# Patient Record
Sex: Male | Born: 1980 | Race: White | Hispanic: No | Marital: Married | Smoking: Never smoker
Health system: Southern US, Community
[De-identification: ages and names within clinical notes are randomized; demographics above are authoritative.]

## PROBLEM LIST (undated history)

## (undated) DIAGNOSIS — K5792 Diverticulitis of intestine, part unspecified, without perforation or abscess without bleeding: Secondary | ICD-10-CM

## (undated) DIAGNOSIS — F32A Depression, unspecified: Secondary | ICD-10-CM

## (undated) DIAGNOSIS — R011 Cardiac murmur, unspecified: Secondary | ICD-10-CM

## (undated) HISTORY — PX: SHOULDER ARTHROSCOPY: SHX128

## (undated) HISTORY — DX: Diverticulitis of intestine, part unspecified, without perforation or abscess without bleeding: K57.92

## (undated) HISTORY — PX: COLONOSCOPY: SHX174

---

## 2012-04-16 ENCOUNTER — Emergency Department: Admit: 2012-04-16 | Discharge: 2012-04-16 | Disposition: A | Discharge status: Home / Self Care | Payer: 59

## 2012-04-16 ENCOUNTER — Emergency Department
Admission: EM | Admit: 2012-04-16 | Discharge: 2012-04-16 | Disposition: A | Discharge status: Home / Self Care | Payer: 59 | Source: Home / Self Care | Attending: Emergency Medicine | Admitting: Emergency Medicine

## 2012-04-16 ENCOUNTER — Emergency Department: Admit: 2012-04-16 | Payer: 59

## 2012-04-16 ENCOUNTER — Encounter: Payer: Self-pay | Admitting: *Deleted

## 2012-04-16 DIAGNOSIS — M79609 Pain in unspecified limb: Secondary | ICD-10-CM

## 2012-04-16 DIAGNOSIS — M79641 Pain in right hand: Secondary | ICD-10-CM
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
## 2012-04-17 ENCOUNTER — Encounter: Payer: Self-pay | Admitting: Family Medicine

## 2012-04-17 ENCOUNTER — Telehealth: Payer: Self-pay | Admitting: *Deleted

## 2012-04-17 ENCOUNTER — Ambulatory Visit (INDEPENDENT_AMBULATORY_CARE_PROVIDER_SITE_OTHER): Payer: 59 | Admitting: Family Medicine

## 2012-04-17 VITALS — BP 116/81 | HR 74 | Temp 97.5°F | Ht 71.0 in | Wt 174.0 lb

## 2012-04-17 DIAGNOSIS — S6990XA Unspecified injury of unspecified wrist, hand and finger(s), initial encounter: Secondary | ICD-10-CM

## 2012-04-17 DIAGNOSIS — S6991XA Unspecified injury of right wrist, hand and finger(s), initial encounter: Secondary | ICD-10-CM

## 2012-04-17 NOTE — ED Notes (Signed)
Patient is inquiring about the result of his x-ray. He was under the impression the radiologist read it  as fractured, as did Dr. Orson Aloe but he was seen by Dr. Pearletha Forge today who di dnot see the fracture. I read the x-ray and verified it was read as "no acute abnormality". I offered a copy of the report to the patient and he declined. He is schedule to have repeat x-ray done with Dr. Pearletha Forge in 10 days. Brian [name] had no further questions.

## 2012-04-17 NOTE — Patient Instructions (Signed)
I do not see an obvious fracture on your x-rays though they can be falsely normal with a hairline fracture. Wear the splint as directed - only take off to bathe and ice the area. Vicodin as needed for severe pain - no driving on this medication. Ice 15 minutes at a time 3-4 times a day. Elevate above the level of your heart when possible. Can take aleve 1-2 tabs twice a day with food if needed in addition to vicodin. Follow up with me in 10 days. Light duty - no use of right hand until further notice.

## 2012-04-18 ENCOUNTER — Encounter: Payer: Self-pay | Admitting: Family Medicine
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
## 2012-04-19 ENCOUNTER — Encounter: Payer: Self-pay | Admitting: Family Medicine

## 2012-04-19 DIAGNOSIS — S6991XA Unspecified injury of right wrist, hand and finger(s), initial encounter: Secondary | ICD-10-CM | POA: Insufficient documentation

## 2012-04-19 NOTE — Assessment & Plan Note (Signed)
reviewed radiographs and I do not appreciate a hairline fracture or other bony abnormalities though his clinical exam would fit with this possibility.  I advised patient we move forward with ulnar gutter splint for immobilization and protection with f/u in 1-2 weeks to remove this, repeat radiographs and exam.  Icing, vicodin q6h prn (#60 written), ibuprofen, elevation in meantime.

## 2012-05-01 ENCOUNTER — Encounter: Payer: Self-pay | Admitting: Family Medicine

## 2012-05-01 ENCOUNTER — Ambulatory Visit (INDEPENDENT_AMBULATORY_CARE_PROVIDER_SITE_OTHER): Payer: 59 | Admitting: Family Medicine

## 2012-05-01 ENCOUNTER — Ambulatory Visit (HOSPITAL_BASED_OUTPATIENT_CLINIC_OR_DEPARTMENT_OTHER)
Admission: RE | Admit: 2012-05-01 | Discharge: 2012-05-01 | Disposition: A | Discharge status: Home / Self Care | Payer: 59 | Source: Ambulatory Visit | Attending: Family Medicine | Admitting: Family Medicine

## 2012-05-01 VITALS — BP 121/78 | HR 73 | Temp 97.8°F | Ht 71.0 in | Wt 173.0 lb

## 2012-05-01 DIAGNOSIS — S6991XA Unspecified injury of right wrist, hand and finger(s), initial encounter: Secondary | ICD-10-CM

## 2012-05-01 DIAGNOSIS — S6991XD Unspecified injury of right wrist, hand and finger(s), subsequent encounter: Secondary | ICD-10-CM

## 2012-05-01 DIAGNOSIS — IMO0002 Reserved for concepts with insufficient information to code with codable children: Secondary | ICD-10-CM

## 2012-05-01 DIAGNOSIS — S6990XA Unspecified injury of unspecified wrist, hand and finger(s), initial encounter: Secondary | ICD-10-CM

## 2012-05-01 DIAGNOSIS — M79609 Pain in unspecified limb: Secondary | ICD-10-CM | POA: Insufficient documentation

## 2012-05-01 NOTE — Assessment & Plan Note (Signed)
radiographs repeated today do not show evidence of a fracture to 4th or 5th MC where he is tender.  Believe this is due to severe contusion.  Discontinue ulnar splinting.  OTC meds as needed (has vicodin also but not using), icing as needed, work on motion.  Believe it will still be another 2 weeks before he has full functionality back of his right hand (for gripping, lifting, carrying, picking things up).  Will keep him on light duty without use of right hand for 2 weeks then return him to full duty.  He's made good progress to this point and feel he will likely be near 100% 2 weeks from now.  F/u prn.
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
## 2012-05-02 ENCOUNTER — Encounter: Payer: Self-pay | Admitting: Family Medicine

## 2012-05-14 ENCOUNTER — Emergency Department (HOSPITAL_BASED_OUTPATIENT_CLINIC_OR_DEPARTMENT_OTHER): Payer: 59

## 2012-05-14 ENCOUNTER — Emergency Department (HOSPITAL_BASED_OUTPATIENT_CLINIC_OR_DEPARTMENT_OTHER)
Admission: EM | Admit: 2012-05-14 | Discharge: 2012-05-15 | Disposition: A | Discharge status: Home / Self Care | Payer: 59 | Attending: Emergency Medicine | Admitting: Emergency Medicine

## 2012-05-14 ENCOUNTER — Encounter (HOSPITAL_BASED_OUTPATIENT_CLINIC_OR_DEPARTMENT_OTHER): Payer: Self-pay | Admitting: *Deleted

## 2012-05-14 DIAGNOSIS — T63461A Toxic effect of venom of wasps, accidental (unintentional), initial encounter: Secondary | ICD-10-CM | POA: Insufficient documentation

## 2012-05-14 DIAGNOSIS — R51 Headache: Secondary | ICD-10-CM | POA: Insufficient documentation

## 2012-05-14 DIAGNOSIS — T6391XA Toxic effect of contact with unspecified venomous animal, accidental (unintentional), initial encounter: Secondary | ICD-10-CM | POA: Insufficient documentation

## 2012-05-14 DIAGNOSIS — T782XXA Anaphylactic shock, unspecified, initial encounter: Secondary | ICD-10-CM

## 2012-05-14 MED ORDER — EPINEPHRINE 0.3 MG/0.3ML IJ DEVI: 0.3000 mg | INTRAMUSCULAR | Status: DC | PRN

## 2012-05-14 MED ORDER — FAMOTIDINE IN NACL 20-0.9 MG/50ML-% IV SOLN
INTRAVENOUS | Status: AC
  Administered 2012-05-14: 20 mg
  Filled 2012-05-14: qty 50

## 2012-05-14 MED ORDER — SODIUM CHLORIDE 0.9 % IV BOLUS (SEPSIS)
1000.0000 mL | Freq: Once | INTRAVENOUS | Status: AC
  Administered 2012-05-14: 1000 mL via INTRAVENOUS

## 2012-05-14 MED ORDER — EPINEPHRINE 0.3 MG/0.3ML IJ DEVI
INTRAMUSCULAR | Status: AC
  Administered 2012-05-14: 0.3 mg
  Filled 2012-05-14: qty 0.3

## 2012-05-14 MED ORDER — METHYLPREDNISOLONE SODIUM SUCC 125 MG IJ SOLR
125.0000 mg | Freq: Once | INTRAMUSCULAR | Status: AC
  Administered 2012-05-14: 125 mg via INTRAVENOUS
  Filled 2012-05-14: qty 2

## 2012-05-14 MED ORDER — DIPHENHYDRAMINE HCL 25 MG PO TABS: 25.0000 mg | ORAL_TABLET | Freq: Four times a day (QID) | ORAL | Status: DC

## 2012-05-14 MED ORDER — FENTANYL CITRATE 0.05 MG/ML IJ SOLN
50.0000 ug | Freq: Once | INTRAMUSCULAR | Status: AC
  Administered 2012-05-14: 50 ug via INTRAVENOUS

## 2012-05-14 MED ORDER — PREDNISONE 10 MG PO TABS: 20.0000 mg | ORAL_TABLET | Freq: Two times a day (BID) | ORAL | Status: AC

## 2012-05-14 MED ORDER — ONDANSETRON HCL 4 MG/2ML IJ SOLN
INTRAMUSCULAR | Status: AC
  Administered 2012-05-14: 4 mg
  Filled 2012-05-14: qty 2

## 2012-05-14 MED ORDER — DIPHENHYDRAMINE HCL 50 MG/ML IJ SOLN
INTRAMUSCULAR | Status: AC
  Administered 2012-05-14: 25 mg
  Filled 2012-05-14: qty 1

## 2012-05-14 MED ORDER — DIPHENHYDRAMINE HCL 50 MG/ML IJ SOLN
12.5000 mg | Freq: Once | INTRAMUSCULAR | Status: AC
  Administered 2012-05-14: 12.5 mg via INTRAVENOUS
  Filled 2012-05-14: qty 1

## 2012-05-14 MED ORDER — FENTANYL CITRATE 0.05 MG/ML IJ SOLN
INTRAMUSCULAR | Status: AC
  Administered 2012-05-14: 50 ug via INTRAVENOUS
  Filled 2012-05-14: qty 2

## 2012-05-14 MED ORDER — METHYLPREDNISOLONE SODIUM SUCC 125 MG IJ SOLR
INTRAMUSCULAR | Status: AC
  Administered 2012-05-14: 125 mg
  Filled 2012-05-14: qty 2
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
## 2012-05-14 NOTE — Discharge Instructions (Signed)
Anaphylactic Reaction  An anaphylactic reaction is a severe allergic reaction. It may be caused by medicines, food, insect bites, or other common items. It cannot spread from one person to another (contagious). It can be life threatening and require hospitalization.   SYMPTOMS   Symptoms may include, but are not limited to, the following:   Skin rash or hives.   Itching.   Chest tightness.   Swelling (including the eyes, tongue, or lips).   Trouble breathing or swallowing.   Lightheadedness or fainting.   Stomach pains or vomiting.  Symptoms may gradually disappear when you are no longer around the substance that caused the problem. You may find that 2 or 3 days are needed for symptoms to go away completely.  HOME CARE INSTRUCTIONS    Carry a card or wear a bracelet that lists anything that has caused past anaphylaxis or a less severe allergic reaction.   If 1 or more medicines have caused past problems, you need to avoid the same or similar medicines in the future.   Talk with a medical caregiver before using any new prescription or over-the-counter medicines.   If you develop hives or a rash:   Apply cold compresses to the skin, or take a cool bath to help reduce itching.   Avoid hot baths or showers. This might make itching or the rash worse.   Wear loose-fitting clothes.   If you have had a severe allergic reaction in the past:   Carry an anaphylaxis kit with you at all times. Both you and family members should be shown how to give the medicines in the kit. This can be lifesaving if there is another severe reaction. If it needs to be used, and symptoms improve, it is still important for you to seek immediate medical care or call your local emergency services (911 in the [place] This is because severe symptoms can return when the medicine in the kit wears off.   If hospitalization is not required, you should have fast access to emergency services if the problem recurs. If a family member or friend  has been shown how to give the medicines in an anaphylaxis kit and can stay with you, he or she can also help give the medicines from the kit if problems return.   Make sure you renew your anaphylaxis kit when it gets close to being out of date.   You may return to your normal activities when the allergic symptoms are gone.  SEEK MEDICAL CARE IF:    You develop symptoms of an allergic reaction to a new substance. Symptoms may occur right away or minutes later.   Rash, hives, or itching occurs.   You have different symptoms than you have had previously.  SEEK IMMEDIATE MEDICAL CARE IF:    You have difficulty breathing, start to wheeze, or a tight feeling in the chest or throat develops.   You develop swelling of the mouth or tongue or swelling or itching over most of your body.   You develop severe stomach pains or repeated vomiting.   You feel very lightheaded or pass out.   Chest pain develops or there is a worsening of problems noted above. THIS IS AN EMERGENCY. Call your local emergency services (911 in the [place]  MAKE SURE YOU:    Understand these instructions.   Will watch your condition.   Will get help right away if you have recurrent problems or have problems that are getting worse.  Document Released:   Information 2012 [place], [place].Bee, Wasp, or Hornet Sting Your caregiver has diagnosed you as having an insect sting. An insect sting appears as a red lump in the skin that sometimes has a tiny hole in the center, or it may have a stinger in the center of the wound. The most common stings are from wasps, hornets and bees. Individuals have different reactions to insect stings.  A normal reaction may cause pain, swelling, and redness around the sting site.   A localized allergic reaction may cause swelling and redness that extends beyond the sting site.   A large local  reaction may continue to develop over the next 12 to 36 hours.   On occasion, the reactions can be severe (anaphylactic reaction). An anaphylactic reaction may cause wheezing; difficulty breathing; chest pain; fainting; raised, itchy, red patches on the skin; a sick feeling to your stomach (nausea); vomiting; cramping; or diarrhea. If you have had an anaphylactic reaction to an insect sting in the past, you are more likely to have one again.  HOME CARE INSTRUCTIONS   With bee stings, a small sac of poison is left in the wound. Brushing across this with something such as a credit card, or anything similar, will help remove this and decrease the amount of the reaction. This same procedure will not help a wasp sting as they do not leave behind a stinger and poison sac.   Apply a cold compress for 10 to 20 minutes every hour for 1 to 2 days, depending on severity, to reduce swelling and itching.   To lessen pain, a paste made of water and baking soda may be rubbed on the bite or sting and left on for 5 minutes.   To relieve itching and swelling, you may use take medication or apply medicated creams or lotions as directed.   Only take over-the-counter or prescription medicines for pain, discomfort, or fever as directed by your caregiver.   Wash the sting site daily with soap and water. Apply antibiotic ointment on the sting site as directed.   If you suffered a severe reaction:   If you did not require hospitalization, an adult will need to stay with you for 24 hours in case the symptoms return.   You may need to wear a medical bracelet or necklace stating the allergy.   You and your family need to learn when and how to use an anaphylaxis kit or epinephrine injection.   If you have had a severe reaction before, always carry your anaphylaxis kit with you.  SEEK MEDICAL CARE IF:   None of the above helps within 2 to 3 days.   The area becomes red, warm, tender, and swollen beyond the area of  the bite or sting.   You have an oral temperature above 102 F (38.9 C).  SEEK IMMEDIATE MEDICAL CARE IF:  You have symptoms of an allergic reaction which are:  Wheezing.   Difficulty breathing.   Chest pain.   Lightheadedness or fainting.   Itchy, raised, red patches on the skin.   Nausea, vomiting, cramping or diarrhea.  ANY OF THESE SYMPTOMS MAY REPRESENT A SERIOUS PROBLEM THAT IS AN EMERGENCY. Do not wait to see if the symptoms will go away. Get medical help right away. Call your local emergency services (911 in [place] DO NOT drive yourself to the hospital. MAKE SURE YOU:   Understand these instructions.   Will watch your condition.   Will get help right away if you are  not doing well or get worse.  Document Released: 11/15/2005 Document Revised: 11/04/2011 Document Reviewed: 05/02/2010 [facility] Patient Information 2012 [place], [place].

## 2012-05-14 NOTE — ED Notes (Addendum)
Presents with anaphylactic reaction to insect sting- took epi-pen and chewed 3 oral benadryl pta- EDP [facility] at bedside- meds given per his verbal order

## 2015-11-30 DIAGNOSIS — K5792 Diverticulitis of intestine, part unspecified, without perforation or abscess without bleeding: Secondary | ICD-10-CM

## 2015-11-30 HISTORY — DX: Diverticulitis of intestine, part unspecified, without perforation or abscess without bleeding: K57.92

## 2016-01-20 MED FILL — AMOXICILLIN 875 MG TABLET: 875 | 10 days supply | Qty: 20 | Fill #0

## 2016-04-09 LAB — HEPATIC FUNCTION PANEL
ALK PHOS: 90 U/L (ref 25–125)
ALT: 16 U/L (ref 10–40)
AST: 13 U/L — AB (ref 14–40)
BILIRUBIN, TOTAL: 1.3 mg/dL

## 2016-04-09 LAB — BASIC METABOLIC PANEL
BUN: 20 mg/dL (ref 4–21)
Creatinine: 1 mg/dL (ref 0.6–1.3)
Glucose: 79 mg/dL
POTASSIUM: 4.8 mmol/L (ref 3.4–5.3)
SODIUM: 142 mmol/L (ref 137–147)

## 2016-04-09 LAB — CBC AND DIFFERENTIAL
HEMATOCRIT: 46 % (ref 41–53)
Hemoglobin: 15.6 g/dL (ref 13.5–17.5)
Platelets: 242 10*3/uL (ref 150–399)
WBC: 10 10^3/mL

## 2016-04-09 LAB — TSH: TSH: 0.57 u[IU]/mL (ref 0.41–5.90)

## 2016-04-09 MED FILL — predniSONE 5 MG TABS: 5 | 6 days supply | Qty: 21 | Fill #0

## 2016-08-13 ENCOUNTER — Emergency Department (INDEPENDENT_AMBULATORY_CARE_PROVIDER_SITE_OTHER): Payer: Commercial Managed Care - HMO

## 2016-08-13 ENCOUNTER — Emergency Department
Admission: EM | Admit: 2016-08-13 | Discharge: 2016-08-13 | Disposition: A | Discharge status: Home / Self Care | Payer: Commercial Managed Care - HMO | Source: Home / Self Care | Attending: Family Medicine | Admitting: Family Medicine

## 2016-08-13 ENCOUNTER — Encounter: Payer: Self-pay | Admitting: *Deleted

## 2016-08-13 DIAGNOSIS — K5732 Diverticulitis of large intestine without perforation or abscess without bleeding: Secondary | ICD-10-CM

## 2016-08-13 DIAGNOSIS — K429 Umbilical hernia without obstruction or gangrene: Secondary | ICD-10-CM | POA: Diagnosis not present

## 2016-08-13 DIAGNOSIS — R1032 Left lower quadrant pain: Secondary | ICD-10-CM

## 2016-08-13 DIAGNOSIS — R8589 Other abnormal findings in specimens from digestive organs and abdominal cavity: Secondary | ICD-10-CM

## 2016-08-13 LAB — POCT URINALYSIS DIP (MANUAL ENTRY)
BILIRUBIN UA: NEGATIVE
Bilirubin, UA: NEGATIVE
Glucose, UA: NEGATIVE
Leukocytes, UA: NEGATIVE
Nitrite, UA: NEGATIVE
PH UA: 5.5 (ref 5–8)
PROTEIN UA: NEGATIVE
RBC UA: NEGATIVE
SPEC GRAV UA: 1.025 (ref 1.005–1.03)
Urobilinogen, UA: 0.2 (ref 0–1)

## 2016-08-13 LAB — POCT CBC W AUTO DIFF (K'VILLE URGENT CARE)

## 2016-08-13 MED ORDER — METRONIDAZOLE 500 MG PO TABS: 500.0000 mg | ORAL_TABLET | Freq: Four times a day (QID) | ORAL | 0 refills | Status: DC

## 2016-08-13 MED ORDER — CIPROFLOXACIN HCL 500 MG PO TABS
500.0000 mg | ORAL_TABLET | Freq: Two times a day (BID) | ORAL | 0 refills | Status: DC
Start: 2016-08-13 — End: 2016-10-28

## 2016-08-13 MED FILL — metroNIDAZOLE 500 MG TABS: 500 | 7 days supply | Qty: 28 | Fill #0

## 2016-08-13 MED FILL — CIPROFLOXACIN HCL 500 MG TA: 500 | 7 days supply | Qty: 14 | Fill #0

## 2016-08-13 NOTE — ED Provider Notes (Signed)
[name]    CSN: [id number] Arrival date & time: 08/13/16  [id number]  First Provider Contact:  First MD Initiated Contact with Patient 08/13/16 1142        History   Chief Complaint Chief Complaint  Patient presents with  . Abdominal Pain    HPI Brian [name] is a 35 y.o. male.   Patient complains of two day history of vague constant non-radiating lower abdominal pain, most prominent on the left.  The pain has awakened him at night.  No fevers, chills, and sweats.  No nausea/vomiting,  Bowel movements have been normal.  No urinary symptoms.  He does not feel ill, although his appetite has been decreased.   He states that he had hematochezia about one year ago, but did not followup with a colonoscopy appointment.   The history is provided by the patient.  Abdominal Pain  Pain location:  LLQ Pain quality: aching, dull and fullness   Pain quality: not bloating   Pain radiates to:  Does not radiate Pain severity:  Mild Onset quality:  Gradual Duration:  2 days Timing:  Constant Progression:  Worsening Chronicity:  New Context: awakening from sleep   Context: not previous surgeries and not trauma   Relieved by:  None tried Worsened by:  Nothing Ineffective treatments:  None tried Associated symptoms: anorexia   Associated symptoms: no belching, no chest pain, no chills, no constipation, no cough, no diarrhea, no dysuria, no fatigue, no fever, no flatus, no hematemesis, no hematochezia, no hematuria, no melena, no nausea, no sore throat and no vomiting     History reviewed. No pertinent past medical history.  Patient Active Problem List   Diagnosis Date Noted  . Injury of right hand 04/19/2012    History reviewed. No pertinent surgical history.     Home Medications    Prior to Admission medications   Medication Sig Start Date End Date Taking? Authorizing Provider  ciprofloxacin (CIPRO) 500 MG tablet Take 1 tablet (500 mg total) by mouth 2 (two) times  daily. (every 12 hours) 08/13/16   Kandra Nicolas, MD  EPINEPHrine (EPIPEN) 0.3 mg/0.3 mL DEVI Inject 0.3 mg into the muscle once.    Historical Provider, MD  metroNIDAZOLE (FLAGYL) 500 MG tablet Take 1 tablet (500 mg total) by mouth 4 (four) times daily. 08/13/16   Kandra Nicolas, MD    Family History Family History  Problem Relation Age of Onset  . Heart attack Father   . Diabetes Neg Hx   . Hyperlipidemia Neg Hx   . Hypertension Neg Hx   . Sudden death Neg Hx     Social History Social History  Substance Use Topics  . Smoking status: Never Smoker  . Smokeless tobacco: Never Used  . Alcohol use 4.8 oz/week    8 Cans of beer per week     Allergies   Bee venom   Review of Systems Review of Systems  Constitutional: Negative for chills, fatigue and fever.  HENT: Negative for sore throat.   Respiratory: Negative for cough.   Cardiovascular: Negative for chest pain.  Gastrointestinal: Positive for abdominal pain and anorexia. Negative for constipation, diarrhea, flatus, hematemesis, hematochezia, melena, nausea and vomiting.  Genitourinary: Negative for dysuria and hematuria.  All other systems reviewed and are negative.    Physical Exam Triage Vital Signs ED Triage Vitals  Enc Vitals Group     BP 08/13/16 1045 112/78     Pulse Rate 08/13/16 1045 69  Resp --      Temp 08/13/16 1045 98.4 F (36.9 C)     Temp Source 08/13/16 1045 Oral     SpO2 08/13/16 1045 97 %     Weight 08/13/16 1045 188 lb (85.3 kg)     Height --      Head Circumference --      Peak Flow --      Pain Score 08/13/16 1048 5     Pain Loc --      Pain Edu? --      Excl. in [place]? --    No data found.   Updated Vital Signs BP 112/78 (BP Location: Left Arm)   Pulse 69   Temp 98.4 F (36.9 C) (Oral)   Wt 188 lb (85.3 kg)   SpO2 97%   BMI 26.22 kg/m   Visual Acuity Right Eye Distance:   Left Eye Distance:   Bilateral Distance:    Right Eye Near:   Left Eye Near:    Bilateral  Near:     Physical Exam  Constitutional: He is oriented to person, place, and time. He appears well-developed and well-nourished. No distress.  HENT:  Head: Normocephalic.  Right Ear: External ear normal.  Left Ear: External ear normal.  Nose: Nose normal.  Mouth/Throat: Oropharynx is clear and moist.  Eyes: Conjunctivae are normal. Pupils are equal, round, and reactive to light.  Neck: Neck supple.  Cardiovascular: Normal heart sounds.   Pulmonary/Chest: Breath sounds normal.  Abdominal: Soft. Bowel sounds are normal. He exhibits no distension and no mass. There is tenderness. There is guarding. There is no rebound. No hernia.    Musculoskeletal: He exhibits no edema or tenderness.  Lymphadenopathy:    He has no cervical adenopathy.  Neurological: He is alert and oriented to person, place, and time.  Skin: Skin is warm and dry. No rash noted.  Nursing note and vitals reviewed.    UC Treatments / Results  Labs (all labs ordered are listed, but only abnormal results are displayed) Labs Reviewed  POCT URINALYSIS DIP (MANUAL ENTRY):  negative  POCT CBC W AUTO DIFF ([facility]):  WBC 8.0; LY 31.8; MO 5.9; GR 62.3; Hgb 16.4; Platelets 219     EKG  EKG Interpretation None       Radiology Ct Abdomen Pelvis W Contrast  Result Date: 08/13/2016 CLINICAL DATA:  LEFT side abdominal pain for 2 days, constant pain which is intermittent sharp and more severe, worsened pain with moving the LEFT abdominal tenderness extending to flank EXAM: CT ABDOMEN AND PELVIS WITH CONTRAST TECHNIQUE: Multidetector CT imaging of the abdomen and pelvis was performed using the standard protocol following bolus administration of intravenous contrast. Sagittal and coronal MPR images reconstructed from axial data set. CONTRAST:  Dilute oral contrast.  100 cc Isovue 300 IV. COMPARISON:  None FINDINGS: Lower chest: Lung bases clear. Hepatobiliary: 6 mm low-attenuation lesion anterior liver image 9  question tiny cyst. Gallbladder and liver otherwise normal appearance Pancreas: Normal appearance Spleen: Normal appearance Adrenals/Urinary Tract: Adrenal glands normal appearance. Kidneys, ureters, and decompressed bladder otherwise unremarkable. Stomach/Bowel: Normal appendix. Focal wall thickening mid descending colon with pericolic infiltrative changes and thickening of the lateral conal fascia. Questionable visualization of a few colonic diverticula. Findings are most suspicious for focal diverticulitis. No evidence of abscess or extraluminal gas. Stomach and remaining bowel loops normal appearance. Vascular/Lymphatic: Normal size mesenteric nodes. Vascular structures unremarkable. Reproductive: N/A Other: No free air or free fluid. Small umbilical  hernia containing fat. Musculoskeletal: Unremarkable IMPRESSION: Focal inflammatory process at the mid descending colon with wall thickening and pericolic infiltration extending to lateral conal fascia, favor mild diverticulitis. Small umbilical hernia. Electronically Signed   By: Lavonia Dana M.D.   On: 08/13/2016 13:00    Procedures Procedures (including critical care time)  Medications Ordered in UC Medications - No data to display   Initial Impression / Assessment and Plan / UC Course  I have reviewed the triage vital signs and the nursing notes.  Pertinent labs & imaging results that were available during my care of the patient were reviewed by me and considered in my medical decision making (see chart for details).  Clinical Course   Normal white blood count reassuring. Begin Cipro 500mg  Q12hr, and Flagyl 500mg  Q6hr. Begin clear liquids for about 18 to 24 hours, then may begin a [employer] (Bananas, Rice, Applesauce, Toast) when improved.  Then gradually advance to a regular diet as tolerated.  May take Tylenol as needed for pain.     If symptoms become significantly worse during the night or over the weekend, proceed to the local emergency  room. Followup with Family Doctor in one week.  Recommend referral to gastroenterologist for follow-up colonoscopy when symptoms resolve (note past history of hematochezia about one year ago).     Final Clinical Impressions(s) / UC Diagnoses   Final diagnoses:  Left lower quadrant pain  Diverticulitis of colon    New Prescriptions New Prescriptions   CIPROFLOXACIN (CIPRO) 500 MG TABLET    Take 1 tablet (500 mg total) by mouth 2 (two) times daily. (every 12 hours)   METRONIDAZOLE (FLAGYL) 500 MG TABLET    Take 1 tablet (500 mg total) by mouth 4 (four) times daily.     Kandra Nicolas, MD 08/13/16 [phone]

## 2016-08-13 NOTE — Discharge Instructions (Addendum)
Begin clear liquids for about 18 to 24 hours, then may begin a [employer] (Bananas, Rice, Applesauce, Toast) when improved.  Then gradually advance to a regular diet as tolerated.  May take Tylenol as needed for pain.     If symptoms become significantly worse during the night or over the weekend, proceed to the local emergency room.

## 2016-08-13 NOTE — ED Triage Notes (Signed)
Pt c/o left sided abdominal pain x 2 days. Denies N/V/D or constipation. Afebrile. Pain is constant and intermittently sharp and more severe. Worse with movement. He chopped wood about 5 days ago but pain did not start until 3 days later.

## 2016-10-28 ENCOUNTER — Ambulatory Visit (INDEPENDENT_AMBULATORY_CARE_PROVIDER_SITE_OTHER): Payer: Commercial Managed Care - HMO | Admitting: Family Medicine

## 2016-10-28 ENCOUNTER — Encounter: Payer: Self-pay | Admitting: Family Medicine

## 2016-10-28 ENCOUNTER — Other Ambulatory Visit: Payer: Commercial Managed Care - HMO

## 2016-10-28 VITALS — BP 114/82 | HR 87 | Temp 98.4°F | Ht 70.0 in | Wt 198.2 lb

## 2016-10-28 DIAGNOSIS — Z130 Encounter for screening for diseases of the blood and blood-forming organs and certain disorders involving the immune mechanism: Secondary | ICD-10-CM

## 2016-10-28 DIAGNOSIS — Z13220 Encounter for screening for lipoid disorders: Secondary | ICD-10-CM | POA: Diagnosis not present

## 2016-10-28 DIAGNOSIS — Z131 Encounter for screening for diabetes mellitus: Secondary | ICD-10-CM | POA: Diagnosis not present

## 2016-10-28 DIAGNOSIS — R002 Palpitations: Secondary | ICD-10-CM | POA: Diagnosis not present

## 2016-10-28 DIAGNOSIS — K5732 Diverticulitis of large intestine without perforation or abscess without bleeding: Secondary | ICD-10-CM | POA: Insufficient documentation

## 2016-10-28 NOTE — Patient Instructions (Addendum)
It was very nice to see you today- have a wonderful holiday season.  I am going to set you up to see cardiology- remember this does not commit you to any testing or other evaluation.  It would certainly be helpful if you can get any information/ reports from your cardiology evaluation as a teen   Assuming that your heart is ok, I would suggest that you work on exercising at least 3-4 days a week.  It does not have to be a huge work-out to be beneficial!

## 2016-10-28 NOTE — Progress Notes (Signed)
[facility] at [facility] [address] [place], [place] [address] [id number] [phone]  Date:  10/28/2016   Name:  Brian [name]   DOB:  11-24-1981   MRN:  [id number]  PCP:  No primary care provider on file.    Chief Complaint: Establish Care (Pt here to est care. Will sign ROI to get records sent to [place] from [place]. )   History of Present Illness:  Brian [name] is a 35 y.o. very pleasant male patient who presents with the following:  Here today as a new patient.   He did have an episode of diverticulitis in September- he had a CT scan which showed diverticulitis per there [facility] He did get well with abx- this was his first and only episode of diverticulitis  He notes that he has gained some weight- he had a baby boy 2 years ago, he is getting less exercise now. He has put on about 25 lbs in the last couple of years. He is concerned about his health and really wants to get back in shape  He has a history of some sort of heart problem when he was younger- he reports that he had an echo, and ?a stress test.   He had this cardiac eval at age 9 or 75; he was not given any sports restrictions.  He notes that he will occasionally have a brief feeling of his heart racing when he exercises. He has had this sort of symptom for years and years- this is why he saw cardiology in his teens.  He describes "a weird fluttering of my heart," and he does occasionally notice this when he is at rest also.  He does not have any CP or SOB generally   Also, about one year ago he had a one day episode of left shoulder pain and CP. He had an EKG that was overall benign.  He did not have cardiology follow-up for this at that time. He has not had this again.    No syncope.  His father had an MI and PCI; he was about 50 yo at that time. He has recovered well- he was also dx with DM however  He is not fasting currently   Patient Active Problem List   Diagnosis Date Noted  . Injury of right hand 04/19/2012    Past Medical History:  Diagnosis Date  . Diverticulitis     History reviewed. No pertinent surgical history.  Social History  Substance Use Topics  . Smoking status: Never Smoker  . Smokeless tobacco: Never Used  . Alcohol use 4.8 oz/week    8 Cans of beer per week    Family History  Problem Relation Age of Onset  . Heart attack Father   . Diabetes Neg Hx   . Hyperlipidemia Neg Hx   . Hypertension Neg Hx   . Sudden death Neg Hx     Allergies  Allergen Reactions  . Bee Venom Anaphylaxis    Medication list has been reviewed and updated.  Current Outpatient Prescriptions on File Prior to Visit  Medication Sig Dispense Refill  . ciprofloxacin (CIPRO) 500 MG tablet Take 1 tablet (500 mg total) by mouth 2 (two) times daily. (every 12 hours) 14 tablet 0  . EPINEPHrine (EPIPEN) 0.3 mg/0.3 mL DEVI Inject 0.3 mg into the muscle once.    . metroNIDAZOLE (FLAGYL) 500 MG tablet Take 1 tablet (500 mg total) by mouth 4 (four)  times daily. 28 tablet 0   No current facility-administered medications on file prior to visit.     Review of Systems:  As per HPI- otherwise negative.   Physical Examination: Vitals:   10/28/16 1727 10/28/16 1729  BP: (!) 115/94 114/82  Pulse: 87   Temp: 98.4 F (36.9 C)    Vitals:   10/28/16 1727  Weight: 198 lb 3.2 oz (89.9 kg)  Height: 5\' 10"  (1.778 m)   Body mass index is 28.44 kg/m. Ideal Body Weight: Weight in (lb) to have BMI = 25: 173.9  GEN: WDWN, NAD, Non-toxic, A & O x 3, looks well, mild overweight HEENT: Atraumatic, Normocephalic. Neck supple. No masses, No LAD. Ears and Nose: No external deformity. CV: RRR, No M/G/R. No JVD. No thrill. No extra heart sounds. PULM: CTA B, no wheezes, crackles, rhonchi. No retractions. No resp. distress. No accessory muscle use. EXTR: No c/c/e NEURO Normal gait.  PSYCH: Normally interactive. Conversant. Not depressed or anxious  appearing.  Calm demeanor.    Assessment and Plan: Heart palpitations - Plan: Ambulatory referral to Cardiology  Screening for hyperlipidemia - Plan: Lipid panel  Screening for diabetes mellitus - Plan: Comprehensive metabolic panel, Hemoglobin A1c  Screening for deficiency anemia - Plan: CBC  Here today to establish care He is not fasting- ordered future labs for him He is concerned about possible expensive cardiac testing- reassured him that he can speak to a cardiologist and get their opinion and then decide about any further testing.  Made referral for him today Will plan further follow- up pending labs. See patient instructions for more details.     Signed Lamar Blinks, MD

## 2016-10-28 NOTE — Progress Notes (Signed)
Pre visit review using our clinic review tool, if applicable. No additional management support is needed unless otherwise documented below in the visit note. 

## 2016-10-29 ENCOUNTER — Other Ambulatory Visit (INDEPENDENT_AMBULATORY_CARE_PROVIDER_SITE_OTHER): Payer: Commercial Managed Care - HMO

## 2016-10-29 ENCOUNTER — Encounter: Payer: Self-pay | Admitting: Family Medicine

## 2016-10-29 DIAGNOSIS — Z130 Encounter for screening for diseases of the blood and blood-forming organs and certain disorders involving the immune mechanism: Secondary | ICD-10-CM

## 2016-10-29 DIAGNOSIS — Z131 Encounter for screening for diabetes mellitus: Secondary | ICD-10-CM | POA: Diagnosis not present

## 2016-10-29 DIAGNOSIS — Z13220 Encounter for screening for lipoid disorders: Secondary | ICD-10-CM

## 2016-10-29 LAB — CBC
HEMATOCRIT: 47 % (ref 39.0–52.0)
HEMOGLOBIN: 15.9 g/dL (ref 13.0–17.0)
MCHC: 33.9 g/dL (ref 30.0–36.0)
MCV: 84.3 fl (ref 78.0–100.0)
Platelets: 216 10*3/uL (ref 150.0–400.0)
RBC: 5.58 Mil/uL (ref 4.22–5.81)
RDW: 13.1 % (ref 11.5–15.5)
WBC: 6.5 10*3/uL (ref 4.0–10.5)

## 2016-10-29 LAB — LIPID PANEL
CHOLESTEROL: 189 mg/dL (ref 0–200)
HDL: 40 mg/dL (ref >39.00)
LDL Cholesterol: 130 mg/dL — ABNORMAL HIGH (ref 0–99)
NonHDL: 149.29
Total CHOL/HDL Ratio: 5
Triglycerides: 98 mg/dL (ref 0.0–149.0)
VLDL: 19.6 mg/dL (ref 0.0–40.0)

## 2016-10-29 LAB — COMPREHENSIVE METABOLIC PANEL
ALBUMIN: 4.6 g/dL (ref 3.5–5.2)
ALK PHOS: 62 U/L (ref 39–117)
ALT: 29 U/L (ref 0–53)
AST: 18 U/L (ref 0–37)
BILIRUBIN TOTAL: 1 mg/dL (ref 0.2–1.2)
BUN: 17 mg/dL (ref 6–23)
CO2: 26 mEq/L (ref 19–32)
Calcium: 9.5 mg/dL (ref 8.4–10.5)
Chloride: 106 mEq/L (ref 96–112)
Creatinine, Ser: 0.97 mg/dL (ref 0.40–1.50)
GFR: 92.63 mL/min (ref >60.00)
Glucose, Bld: 93 mg/dL (ref 70–99)
POTASSIUM: 4 meq/L (ref 3.5–5.1)
Sodium: 142 mEq/L (ref 135–145)
TOTAL PROTEIN: 6.8 g/dL (ref 6.0–8.3)

## 2016-10-29 LAB — HEMOGLOBIN A1C: HEMOGLOBIN A1C: 5.1 % (ref 4.6–6.5)

## 2016-11-17 ENCOUNTER — Telehealth: Payer: Self-pay | Admitting: Family Medicine

## 2016-11-17 NOTE — Telephone Encounter (Signed)
Received his records from [facility]- will review and scan as needed

## 2016-11-19 ENCOUNTER — Encounter: Payer: Self-pay | Admitting: Family Medicine

## 2016-11-19 ENCOUNTER — Telehealth: Payer: Self-pay | Admitting: Family Medicine

## 2016-11-19 MED ORDER — OSELTAMIVIR PHOSPHATE 75 MG PO CAPS: ORAL_CAPSULE | ORAL | 0 refills | Status: DC

## 2016-11-19 MED FILL — OSELTAMIVIR PHOS 75 MG CAP: 75 | 10 days supply | Qty: 10 | Fill #0

## 2016-11-19 NOTE — Telephone Encounter (Signed)
Rx sent to [name], pt requested [name]. Rx canceled at [facility] and resent to [facility]. Pt notified and verbalized understanding.

## 2016-11-19 NOTE — Telephone Encounter (Addendum)
Relation to pt: self Call back number: [phone]  Pharmacy: [facility] [address] [phone] (Phone) [phone] (Fax)     Reason for call:  Patient child has been dx with the flu, pedicatricans advised to contact PCP and request Rx, please advise

## 2016-11-19 NOTE — Telephone Encounter (Signed)
Rx of tamiflu sent in to pharmacy for prevention due to close contact.

## 2016-11-19 NOTE — Telephone Encounter (Signed)
Please advise rx for flu prophylaxis in PCP absence.

## 2016-11-25 MED FILL — AMOXICILLIN 875 MG TABLET: 875 | 10 days supply | Qty: 20 | Fill #0

## 2016-11-25 MED FILL — PROMETHAZINE-DM SYRUP: 6.25-15 | 24 days supply | Qty: 120 | Fill #0

## 2016-12-10 ENCOUNTER — Encounter: Payer: Self-pay | Admitting: Family Medicine

## 2016-12-10 ENCOUNTER — Ambulatory Visit (INDEPENDENT_AMBULATORY_CARE_PROVIDER_SITE_OTHER): Payer: Commercial Managed Care - HMO | Admitting: Family Medicine

## 2016-12-10 VITALS — BP 115/68 | HR 99 | Temp 98.4°F | Ht 70.0 in | Wt 185.0 lb

## 2016-12-10 DIAGNOSIS — A09 Infectious gastroenteritis and colitis, unspecified: Secondary | ICD-10-CM | POA: Diagnosis not present

## 2016-12-10 DIAGNOSIS — R197 Diarrhea, unspecified: Secondary | ICD-10-CM

## 2016-12-10 LAB — CBC WITH DIFFERENTIAL/PLATELET
BASOS PCT: 0.4 % (ref 0.0–3.0)
Basophils Absolute: 0 10*3/uL (ref 0.0–0.1)
Eosinophils Absolute: 0.1 10*3/uL (ref 0.0–0.7)
Eosinophils Relative: 1.3 % (ref 0.0–5.0)
HCT: 42.6 % (ref 39.0–52.0)
HEMOGLOBIN: 14.6 g/dL (ref 13.0–17.0)
LYMPHS ABS: 1.9 10*3/uL (ref 0.7–4.0)
Lymphocytes Relative: 18.2 % (ref 12.0–46.0)
MCHC: 34.4 g/dL (ref 30.0–36.0)
MCV: 82.6 fl (ref 78.0–100.0)
MONO ABS: 0.8 10*3/uL (ref 0.1–1.0)
Monocytes Relative: 7.7 % (ref 3.0–12.0)
NEUTROS PCT: 72.4 % (ref 43.0–77.0)
Neutro Abs: 7.6 10*3/uL (ref 1.4–7.7)
Platelets: 300 10*3/uL (ref 150.0–400.0)
RBC: 5.16 Mil/uL (ref 4.22–5.81)
RDW: 13 % (ref 11.5–15.5)
WBC: 10.5 10*3/uL (ref 4.0–10.5)

## 2016-12-10 LAB — BASIC METABOLIC PANEL
BUN: 13 mg/dL (ref 6–23)
CALCIUM: 9.1 mg/dL (ref 8.4–10.5)
CO2: 27 mEq/L (ref 19–32)
CREATININE: 0.94 mg/dL (ref 0.40–1.50)
Chloride: 107 mEq/L (ref 96–112)
GFR: 95.99 mL/min (ref >60.00)
GLUCOSE: 84 mg/dL (ref 70–99)
Potassium: 3.7 mEq/L (ref 3.5–5.1)
SODIUM: 140 meq/L (ref 135–145)

## 2016-12-10 NOTE — Progress Notes (Signed)
Chief Complaint  Patient presents with  . Diarrhea    Vomiting x 8 days. Vomitting has stopped, but diarrhea has persisted.  . Weight Loss    Subjective: Patient is a 36 y.o. male here for diarrhea.   The patient has been expressing 8 days of diarrhea. He initially had associated nausea and vomiting however that resolved after 1.5 days. He also had a low-grade fever that resolved early in the course. Currently, he is having watery diarrhea. As this is not resolved, he is here for further evaluation. He was recently treated for a sinus infection with amoxicillin. This 10 day course ended 4 days ago. He denies any recent travel, sick contacts, fevers, abdominal pain, current nausea/vomiting, blood in stool, dark tarry stools, mucus, or any urinary complaints. He did have diverticulitis this past year, however states this is nothing like that. He does not take any medications routinely. He does not have any chronic issues. He has tried some over-the-counter antidiarrheal medication with little relief. He is not tolerating much for a diet, as this will cause more diarrhea. He is able to keep down fluids and has been drinking Gatorade or cutting Gatorade with water.  ROS: GI: As noted in HPI Const: No fevers  Family History  Problem Relation Age of Onset  . Heart attack Father   . Diabetes Neg Hx   . Hyperlipidemia Neg Hx   . Hypertension Neg Hx   . Sudden death Neg Hx    Past Medical History:  Diagnosis Date  . Diverticulitis 2017   Allergies  Allergen Reactions  . Bee Venom Anaphylaxis    Current Outpatient Prescriptions:  .  EPINEPHrine (EPIPEN) 0.3 mg/0.3 mL DEVI, Inject 0.3 mg into the muscle once., Disp: , Rfl:   Objective: BP 115/68 (BP Location: Right Arm, Patient Position: Sitting, Cuff Size: Normal)   Pulse 99   Temp 98.4 F (36.9 C) (Oral)   Ht 5\' 10"  (1.778 m)   Wt 185 lb (83.9 kg)   SpO2 95% Comment: RA  BMI 26.54 kg/m  General: Awake, appears stated age HEENT:  MMM, appropriate pool saliva on floor of mouth Heart: RRR, no murmurs Lungs: CTAB, no rales, wheezes or rhonchi. No accessory muscle use Abd: BS+, soft, NT, ND, no masses or organomegaly Skin: Warm and dry Psych: Age appropriate judgment and insight, normal affect and mood  Assessment and Plan: Diarrhea of presumed infectious origin - Plan: Ova and parasite examination, Stool Culture, Stool, WBC/Lactoferrin, E Coli Shiga Toxin, EIA, CBC w/Diff, Basic Metabolic Panel (BMET)  Orders as above. As this is been going on for several days, we'll culture. Due to frequent episodes of diarrhea, I will make sure his electrolytes look okay and that he is not having any suggestion of bleeding. He does not have any physical exam findings suggestive of dehydration. Okay to try different food items to see if it affects his diarrhea. I told the patient that he has until 7 PM until our lab closes. We will contact him based on the results of the stool studies. This will likely be next week. Continue supportive care in the meanwhile. If he starts having fevers, abdominal pain, or bleeding, should seek medical care. Follow-up as needed otherwise. The patient voiced understanding and agreement to the plan.  [place], DO 12/10/16  12:10 PM

## 2016-12-10 NOTE — Addendum Note (Signed)
Addended by: Peggyann Shoals on: 12/10/2016 04:10 PM   Modules accepted: Orders

## 2016-12-10 NOTE — Progress Notes (Signed)
Pre visit review using our clinic review tool, if applicable. No additional management support is needed unless otherwise documented below in the visit note. 

## 2016-12-10 NOTE — Patient Instructions (Addendum)
Bring your sample back to the lab by 7 PM.  Keep hydrating with Gatorade/sports drink of choice. Pedialyte is another option.   If you start having fevers, pain, bleeding in your stool, go to ED.

## 2016-12-13 LAB — FECAL LACTOFERRIN, QUANT: Lactoferrin: NEGATIVE

## 2016-12-14 LAB — OVA AND PARASITE EXAMINATION: OP: NONE SEEN

## 2016-12-16 LAB — STOOL CULTURE

## 2017-09-03 NOTE — Progress Notes (Signed)
[facility] [address], [place], [place] [address] [phone]  Date:  09/05/2017   Name:  Brian [name]   DOB:  14-Oct-1981   MRN:  [id number]  PCP:  Darreld Mclean, MD    Chief Complaint: Knee Pain (rIGHT)   History of Present Illness:  Brian [name] is a 36 y.o. very pleasant male patient who presents with the following:  Generally healthy young man here today with concern a knee problem Flu shot: will get at work  Tetanus vaccine:  From our last visit about a year ago: He did have an episode of diverticulitis in September- he had a CT scan which showed diverticulitis per there [facility] Mor[facility] He did get well with abx- this was his first and only episode of diverticulitis  He notes that he has gained some weight- he had a baby boy 2 years ago, he is getting less exercise now. He has put on about 25 lbs in the last couple of years. He is concerned about his health and really wants to get back in shape  He has a history of some sort of heart problem when he was younger- he reports that he had an echo, and ?a stress test.   He had this cardiac eval at age 70 or 49; he was not given any sports restrictions.  He notes that he will occasionally have a brief feeling of his heart racing when he exercises. He has had this sort of symptom for years and years- this is why he saw cardiology in his teens.  He describes "a weird fluttering of my heart," and he does occasionally notice this when he is at rest also.  He does not have any CP or SOB generally   He notes that if he lifts leg weights, or does a lot of knee bending he will have a lot of RIGHT lateral knee pain He has noted this for the last 6-8 months but is getting worse He is a runner, and notes that his knees ache more as he gets a bit older and continues to run, although not as much as she ran in the past  He does still run some now- 2-3 miles a couple of  times a week Never had any swelling of his knee He hurt his knee in soccer during high school but this seemed to totally resolve- not aware of any more recent injury to his knee   No clicking or popping.  It may feel tight, and "like it's pulling."  No recent x-rays of his knee  His son is nearly 30 yo and is doing well.  His 42 month old daughter was born with adrenal insuf which has caused some health challenges for her  Pt reports that he did not get his labs from last year- I reprinted letter and gave to him, advised that I always communicate results, please let me know if he does not get results in the future.  Also, there was a question of an abnormal CXR in the past.  Looking back he did have a CT for follow-up of the film in 2017, but needs a plain film to follow-up on this  Will get a CXR for him today  Wt Readings from Last 3 Encounters:  09/05/17 195 lb 12.8 oz (88.8 kg)  12/10/16 185 lb (83.9 kg)  10/28/16 198 lb 3.2 oz (89.9 kg)     Patient Active Problem  List   Diagnosis Date Noted  . Diverticulitis of colon 10/28/2016  . Injury of right hand 04/19/2012    Past Medical History:  Diagnosis Date  . Diverticulitis 2017    No past surgical history on file.  Social History  Substance Use Topics  . Smoking status: Never Smoker  . Smokeless tobacco: Never Used  . Alcohol use 4.8 oz/week    8 Cans of beer per week    Family History  Problem Relation Age of Onset  . Heart attack Father   . Diabetes Neg Hx   . Hyperlipidemia Neg Hx   . Hypertension Neg Hx   . Sudden death Neg Hx     Allergies  Allergen Reactions  . Bee Venom Anaphylaxis    Medication list has been reviewed and updated.  Current Outpatient Prescriptions on File Prior to Visit  Medication Sig Dispense Refill  . EPINEPHrine (EPIPEN) 0.3 mg/0.3 mL DEVI Inject 0.3 mg into the muscle once.     No current facility-administered medications on file prior to visit.     Review of Systems:  As  per HPI- otherwise negative. No fever or chills No CP or SOB No cough, ST, rash No nausea, vomiting or diarrhea    Physical Examination: Vitals:   09/05/17 1526  BP: 115/77  Pulse: 92  Temp: 98.2 F (36.8 C)  SpO2: 95%   Vitals:   09/05/17 1526  Weight: 195 lb 12.8 oz (88.8 kg)  Height: 5\' 10"  (1.778 m)   Body mass index is 28.09 kg/m. Ideal Body Weight: Weight in (lb) to have BMI = 25: 173.9  GEN: WDWN, NAD, Non-toxic, A & O x 3, appears well HEENT: Atraumatic, Normocephalic. Neck supple. No masses, No LAD. Ears and Nose: No external deformity. CV: RRR, No M/G/R. No JVD. No thrill. No extra heart sounds. PULM: CTA B, no wheezes, crackles, rhonchi. No retractions. No resp. distress. No accessory muscle use. ABD: S, NT, ND EXTR: No c/c/e NEURO Normal gait.  PSYCH: Normally interactive. Conversant. Not depressed or anxious appearing.  Calm demeanor.  Right knee: normal ROM, no crepitus, no popping or clicking. No heat, swelling or bruise  Assessment and Plan: Chronic pain of right knee - Plan: DG Knee Complete 4 Views Right  Lung nodule < 6cm on CT - Plan: DG Chest 2 View  Bee allergy status - Plan: EPINEPHrine 0.3 mg/0.3 mL IJ SOAJ injection  Will obtain plain films of his knee today- may need to order MRI vs refer to ortho. He is using advil as needed. Tried a knee sleeve but it seemed to irritate his skin Follow-up with plain CXR today Refilled his epipen to have on hand   Dg Chest 2 View  Result Date: 09/05/2017 CLINICAL DATA:  Followup pulmonary nodule. EXAM: CHEST  2 VIEW COMPARISON:  Chest CT 04/09/2016 and chest x-ray 04/09/2016. FINDINGS: The cardiac silhouette, mediastinal and hilar contours are within normal limits and stable. The lungs are clear of an acute process. No pulmonary edema or pleural effusion. On the lateral film there is a rounded density superimposed of the T11 vertebral body. This is unchanged compared to the prior x-ray. No definite  abnormality is seen on the prior chest CT. IMPRESSION: No acute cardiopulmonary findings. Stable rounded density on the lateral film overlying T11. No abnormalities seen on the prior chest CT suggesting a vessel on end. Electronically Signed   By: Marijo Sanes M.D.   On: 09/05/2017 16:28   Dg Knee Complete  4 Views Right  Result Date: 09/05/2017 CLINICAL DATA:  Right knee pain for 6-8 months. EXAM: RIGHT KNEE - COMPLETE 4+ VIEW COMPARISON:  None. FINDINGS: The joint spaces are maintained. No fracture or osteochondral lesion. Remote healed fibrous cortical lesion noted in the femoral metaphysis. No definite joint effusion. IMPRESSION: No acute bony findings, degenerative changes or joint effusion. Electronically Signed   By: Marijo Sanes M.D.   On: 09/05/2017 16:30   Received his x-rays and called. LMOM Chest looks fine, good news Knee is also ok- will refer to ortho.  If he prefers to try and get an MRI first let me know   Signed Lamar Blinks, MD

## 2017-09-05 ENCOUNTER — Ambulatory Visit (HOSPITAL_BASED_OUTPATIENT_CLINIC_OR_DEPARTMENT_OTHER)
Admission: RE | Admit: 2017-09-05 | Discharge: 2017-09-05 | Disposition: A | Discharge status: Home / Self Care | Payer: 59 | Source: Ambulatory Visit | Attending: Family Medicine | Admitting: Family Medicine

## 2017-09-05 ENCOUNTER — Encounter: Payer: Self-pay | Admitting: Family Medicine

## 2017-09-05 ENCOUNTER — Ambulatory Visit (INDEPENDENT_AMBULATORY_CARE_PROVIDER_SITE_OTHER): Payer: 59 | Admitting: Family Medicine

## 2017-09-05 ENCOUNTER — Other Ambulatory Visit: Payer: Self-pay

## 2017-09-05 VITALS — BP 115/77 | HR 92 | Temp 98.2°F | Ht 70.0 in | Wt 195.8 lb

## 2017-09-05 DIAGNOSIS — IMO0001 Reserved for inherently not codable concepts without codable children: Secondary | ICD-10-CM

## 2017-09-05 DIAGNOSIS — Z91030 Bee allergy status: Secondary | ICD-10-CM

## 2017-09-05 DIAGNOSIS — M25561 Pain in right knee: Secondary | ICD-10-CM

## 2017-09-05 DIAGNOSIS — R911 Solitary pulmonary nodule: Secondary | ICD-10-CM

## 2017-09-05 DIAGNOSIS — G8929 Other chronic pain: Secondary | ICD-10-CM | POA: Insufficient documentation

## 2017-09-05 DIAGNOSIS — M8588 Other specified disorders of bone density and structure, other site: Secondary | ICD-10-CM | POA: Diagnosis not present

## 2017-09-05 MED ORDER — EPINEPHRINE 0.3 MG/0.3ML IJ SOAJ: 0.3000 mg | Freq: Once | INTRAMUSCULAR | 99 refills | Status: AC

## 2017-09-05 MED FILL — EPINEPHRINE 0.3 MG AUTO-INJ: 0.3 | 2 days supply | Qty: 2 | Fill #0

## 2017-09-05 NOTE — Patient Instructions (Addendum)
Chest x-ray 04/09/16 CHEST2 VIEW  COMPARISON:Chest x-ray of October 19, 2015  FINDINGS: The lungs are adequately inflated and clear. There is a stable nodule projecting over the T11 vertebral body which is better demonstrated today but is not clearly new. It is not triangulated on the frontal view. The heart and pulmonary vascularity are normal. The mediastinum is normal in width. There is no pleural effusion. The bony thorax exhibits no acute abnormality.  IMPRESSION: There is no pneumonia nor CHF. There is a 5 mm diameter nodule that projects over the T11 vertebral body that may be least partially calcified. It is demonstrated to better advantage today than on the previous study likely due to technical factors.  Given the patient's chronic cough at minimum a chest x-ray in 4-6 months is recommended. Alternatively, chest CT scanning now would be a useful next step to allow further evaluation of the nodule and the pulmonary parenchyma elsewhere.   CT chest 04/09/16 CLINICAL DATA:Cough 3 weeks with fatigue. Chest x-ray today demonstrates nodule.  EXAM: CT CHEST WITHOUT CONTRAST  TECHNIQUE: Multidetector CT imaging of the chest was performed following the standard protocol without IV contrast.  COMPARISON:None.  FINDINGS: Lungs are well inflated and demonstrate consolidation over the anterior medial aspect of the inferior right upper lobe abutting the minor fissure. This also mild focal reticulonodular opacification over the posterior left upper lobe/lingula. Findings are likely due to pneumonia. Airways are within normal.  Heart is normal in size. Vascular structures are unremarkable. No significant hilar or mediastinal adenopathy. Remaining mediastinal structures are unremarkable.  Images through the upper abdomen demonstrate a sub cm hypodensity over the dome of the left lobe of the liver likely a cyst.  IMPRESSION: Consolidation over the inferior aspect  of the right upper lobe with mild reticulonodular opacification over the posterior aspect of the left upper lobe likely multifocal pneumonia. Recommend follow-up chest radiograph to resolution.  We will get a repeat chest x-ray today to follow-up on lung findings as recommended above.  Will also get x-rays of your knee- I will be in touch with your results asap

## 2017-09-06 ENCOUNTER — Telehealth: Payer: Self-pay | Admitting: Family Medicine

## 2017-09-06 NOTE — Telephone Encounter (Signed)
Yes, please let him know that we have referred him to [facility], which is the office at [facility]

## 2017-09-06 NOTE — Telephone Encounter (Signed)
°  Relation to pt: self Call back number:[phone] Pharmacy:  Reason for call:  Patient returning call regarding orthopedic surgeon referral, patient would like to confirm he would like an orthopedic surgeon located in [place], please advise

## 2017-09-07 NOTE — Telephone Encounter (Signed)
Patient informed. 

## 2017-09-10 IMAGING — CT CT ABD-PELV W/ CM
2 of 5 series · 16 of 46 positions shown, 18 images · IV contrast (APPLIED)
Comparison: None

CLINICAL DATA: LEFT side abdominal pain for 2 days, constant pain
which is intermittent sharp and more severe, worsened pain with
moving the LEFT abdominal tenderness extending to flank

EXAM:
CT ABDOMEN AND PELVIS WITH CONTRAST
TECHNIQUE: Multidetector CT imaging of the abdomen and pelvis was performed
using the standard protocol following bolus administration of
intravenous contrast. Sagittal and coronal MPR images reconstructed
from axial data set.
CONTRAST:  Dilute oral contrast.  100 cc Isovue 300 IV.

[Series 2: axial st · axial · 0.85mm/px · z∈[-530,-90]mm · 13 of 100 slices shown, 15 images]
[im 6/100  soft-tissue]
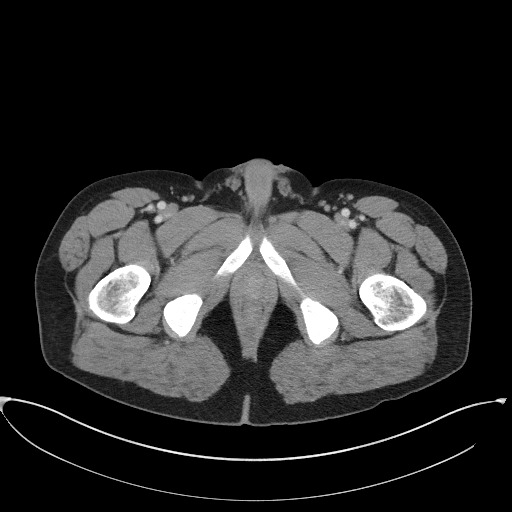
[im 6/100  bone]
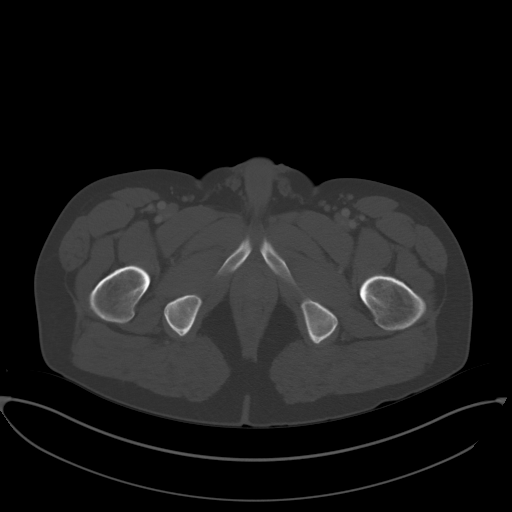
[im 12/100  soft-tissue]
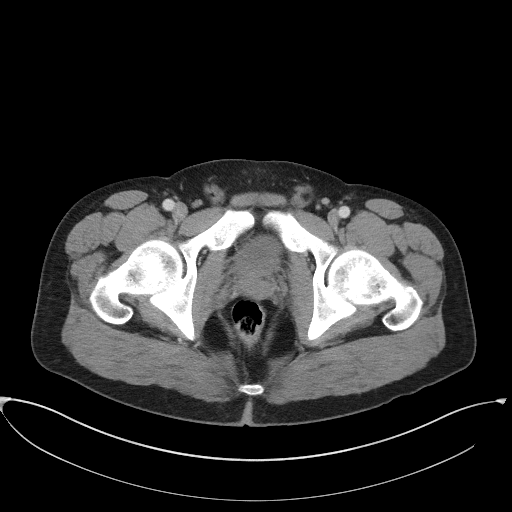
[im 23/100  soft-tissue]
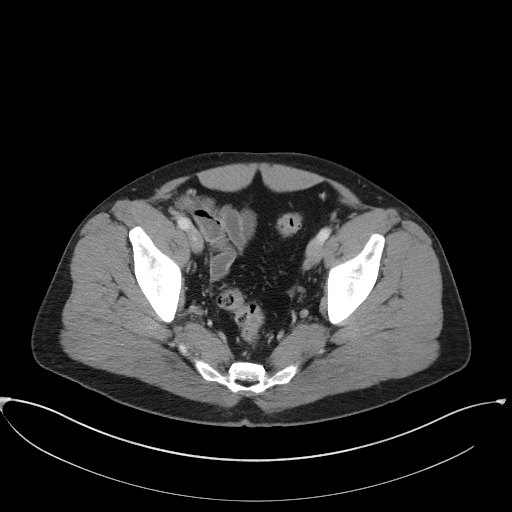
[im 28/100  soft-tissue]
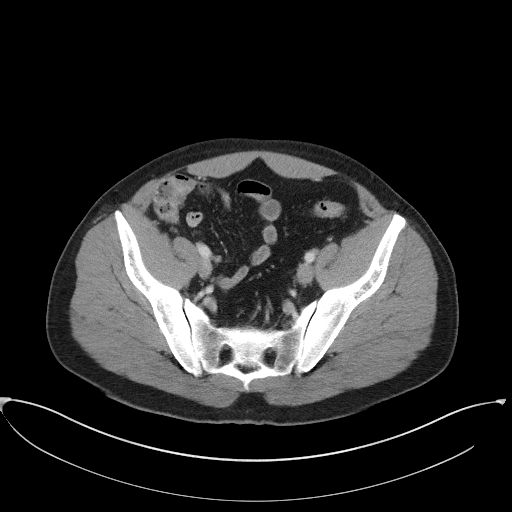
[im 34/100  soft-tissue]
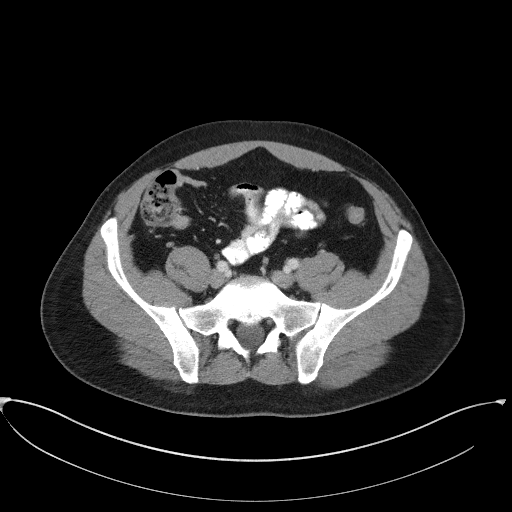
[im 45/100  soft-tissue]
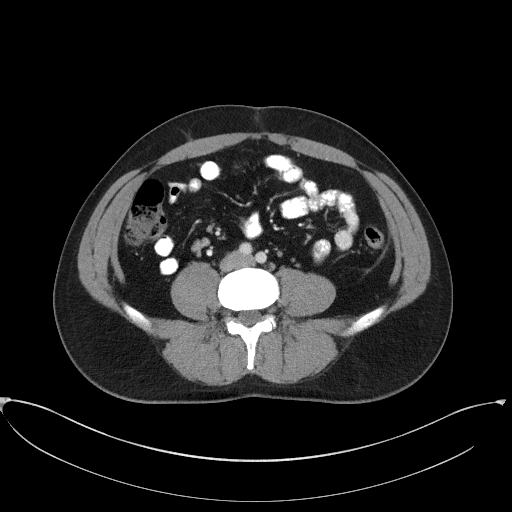
[im 50/100  soft-tissue]
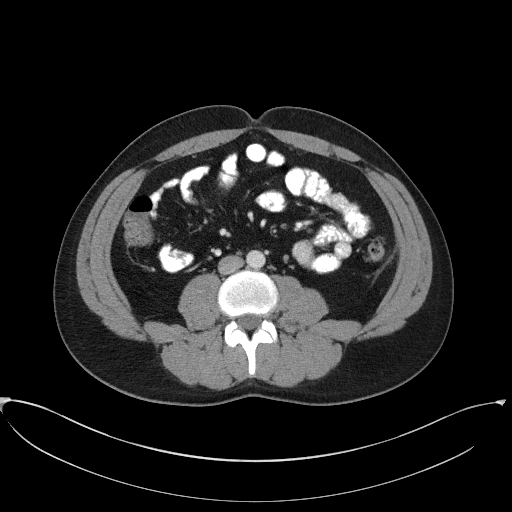
[im 56/100  soft-tissue]
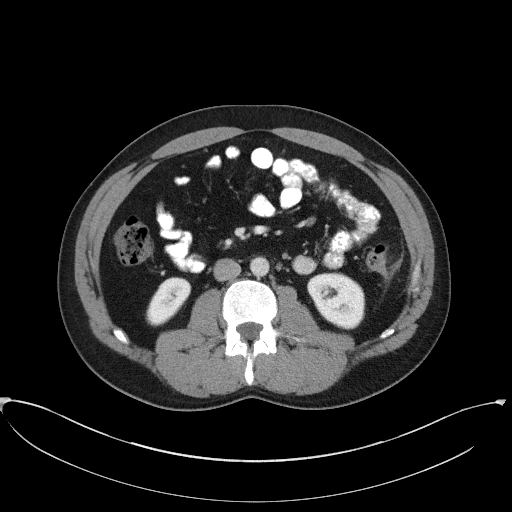
[im 67/100  soft-tissue]
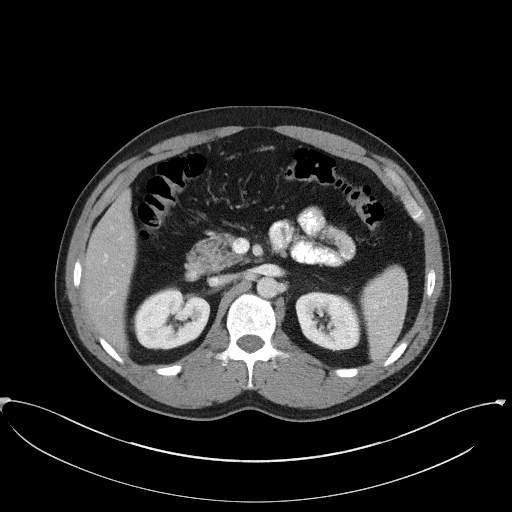
[im 67/100  bone]
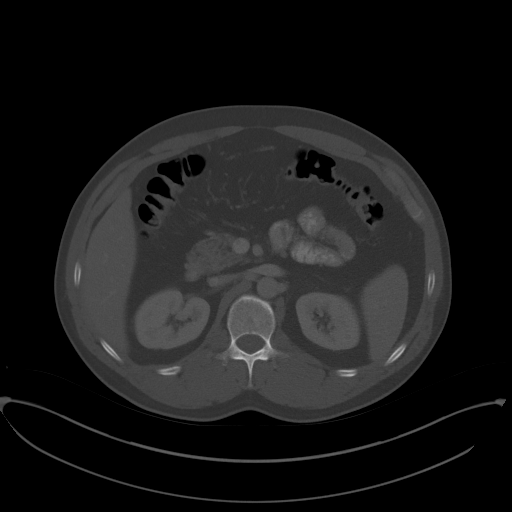
[im 72/100  soft-tissue]
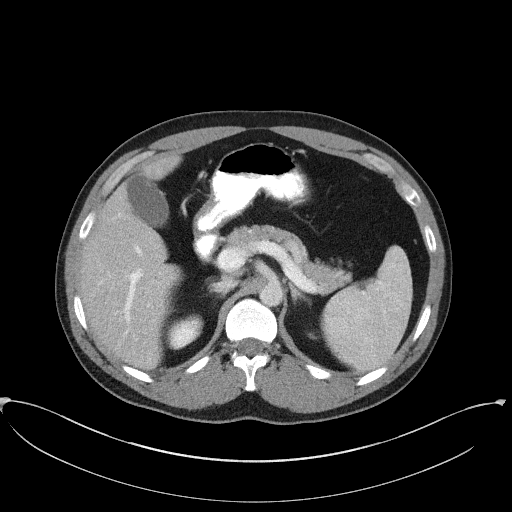
[im 78/100  soft-tissue]
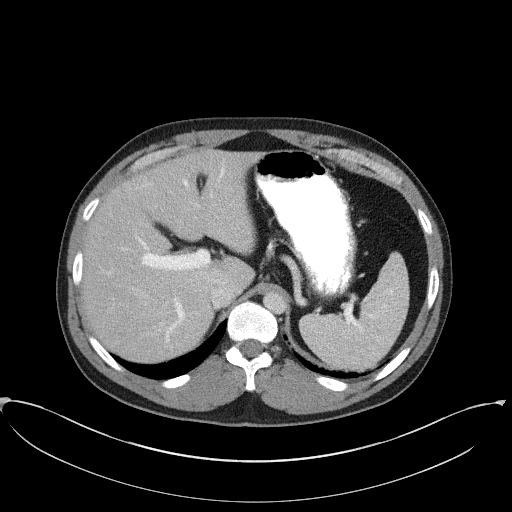
[im 89/100  soft-tissue]
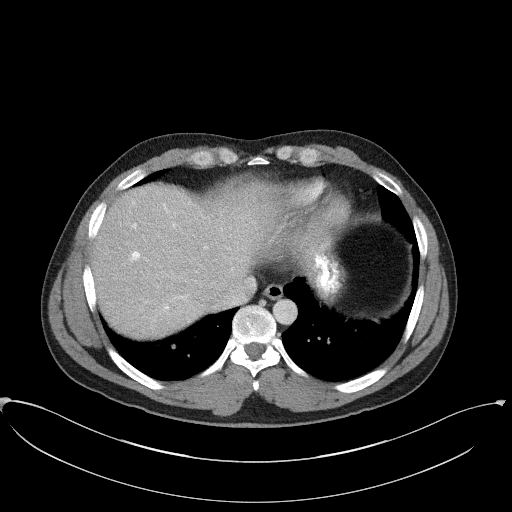
[im 94/100  soft-tissue]
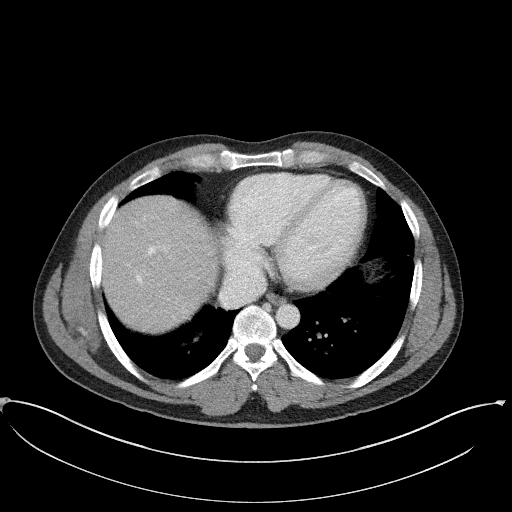

[Series 5: coronal st · coronal · 0.90mm/px · 3 of 89 slices shown]
[im 30/89  soft-tissue]
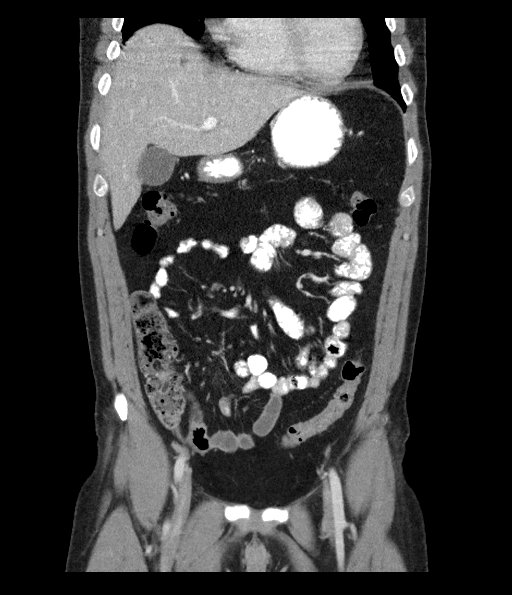
[im 40/89  soft-tissue]
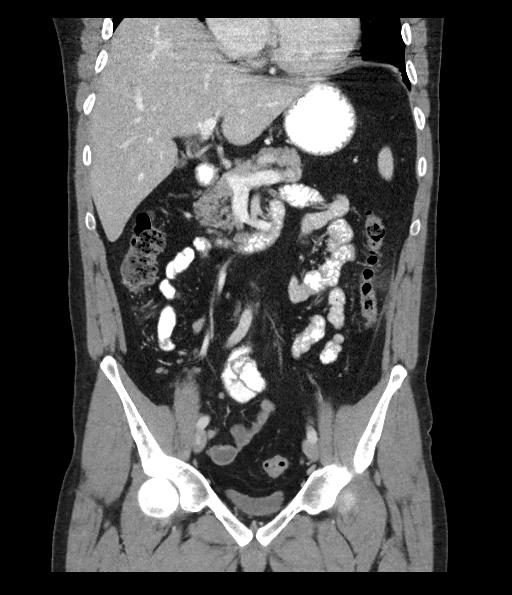
[im 49/89  soft-tissue]
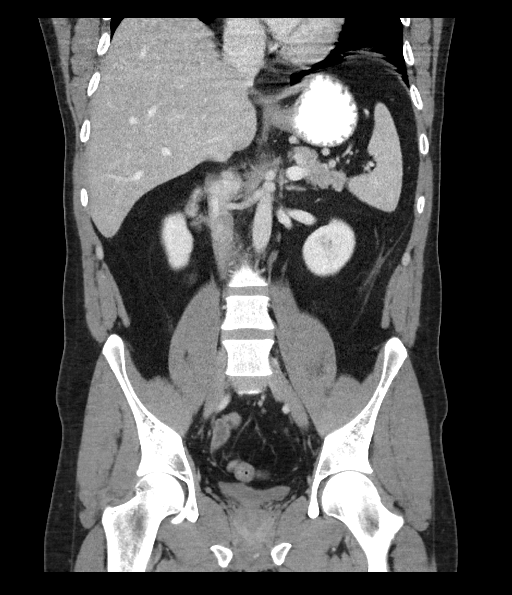

[16 of 46 positions shown; findings below may reference images not displayed]

FINDINGS: Lower chest: Lung bases clear.

Hepatobiliary: 6 mm low-attenuation lesion anterior liver image 9
question tiny cyst. Gallbladder and liver otherwise normal
appearance

Pancreas: Normal appearance

Spleen: Normal appearance

Adrenals/Urinary Tract: Adrenal glands normal appearance. Kidneys,
ureters, and decompressed bladder otherwise unremarkable.

Stomach/Bowel: Normal appendix. Focal wall thickening mid descending
colon with pericolic infiltrative changes and thickening of the
lateral conal fascia. Questionable visualization of a few colonic
diverticula. Findings are most suspicious for focal diverticulitis.
No evidence of abscess or extraluminal gas. Stomach and remaining
bowel loops normal appearance.

Vascular/Lymphatic: Normal size mesenteric nodes. Vascular
structures unremarkable.

Reproductive: N/A

Other: No free air or free fluid. Small umbilical hernia containing
fat.

Musculoskeletal: Unremarkable
IMPRESSION: Focal inflammatory process at the mid descending colon with wall
thickening and pericolic infiltration extending to lateral conal
fascia, favor mild diverticulitis.

Small umbilical hernia.

## 2017-09-14 DIAGNOSIS — M7631 Iliotibial band syndrome, right leg: Secondary | ICD-10-CM | POA: Diagnosis not present

## 2017-09-14 DIAGNOSIS — M2391 Unspecified internal derangement of right knee: Secondary | ICD-10-CM | POA: Diagnosis not present

## 2017-12-19 MED FILL — diazePAM 10 MG TABS: 10 | 1 days supply | Qty: 2 | Fill #0

## 2018-07-18 DIAGNOSIS — D2272 Melanocytic nevi of left lower limb, including hip: Secondary | ICD-10-CM | POA: Diagnosis not present

## 2018-07-18 MED FILL — NYSTATIN 100,000 UNITS/GM O: 100000 | 30 days supply | Qty: 30 | Fill #0

## 2018-08-08 DIAGNOSIS — D2362 Other benign neoplasm of skin of left upper limb, including shoulder: Secondary | ICD-10-CM | POA: Diagnosis not present

## 2018-08-08 DIAGNOSIS — D485 Neoplasm of uncertain behavior of skin: Secondary | ICD-10-CM | POA: Diagnosis not present

## 2018-08-08 DIAGNOSIS — D2272 Melanocytic nevi of left lower limb, including hip: Secondary | ICD-10-CM | POA: Diagnosis not present

## 2018-10-03 IMAGING — CR DG KNEE COMPLETE 4+V*R*
4 series · 4 of 4 positions shown · non-contrast
Comparison: None.

CLINICAL DATA: Right knee pain for 6-8 months.

EXAM:
RIGHT KNEE - COMPLETE 4+ VIEW

[t knee ap right]
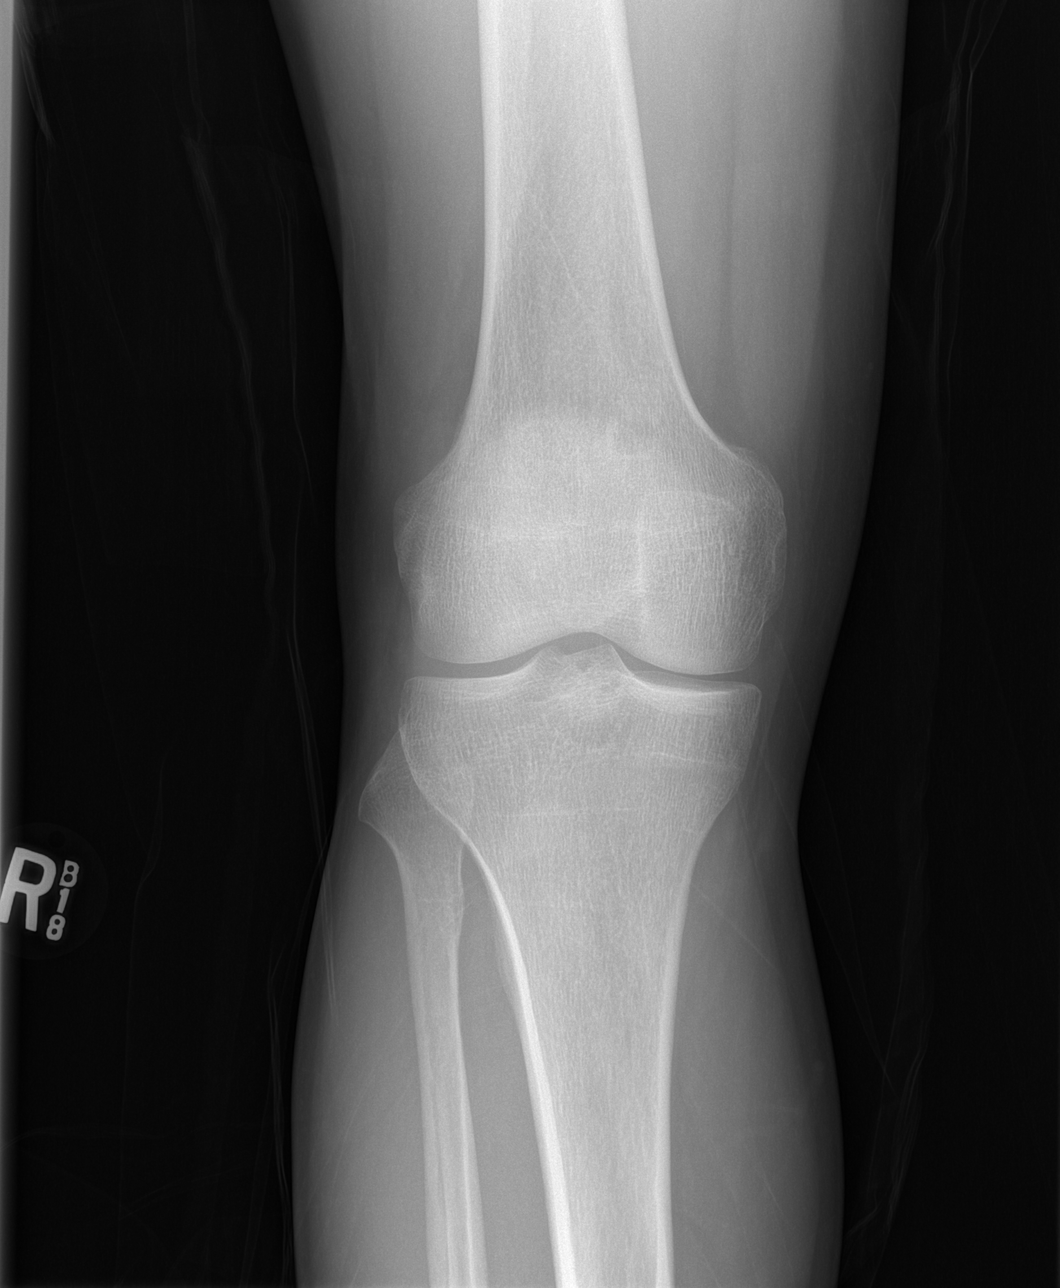

[t knee oblique right (1 of 2)]
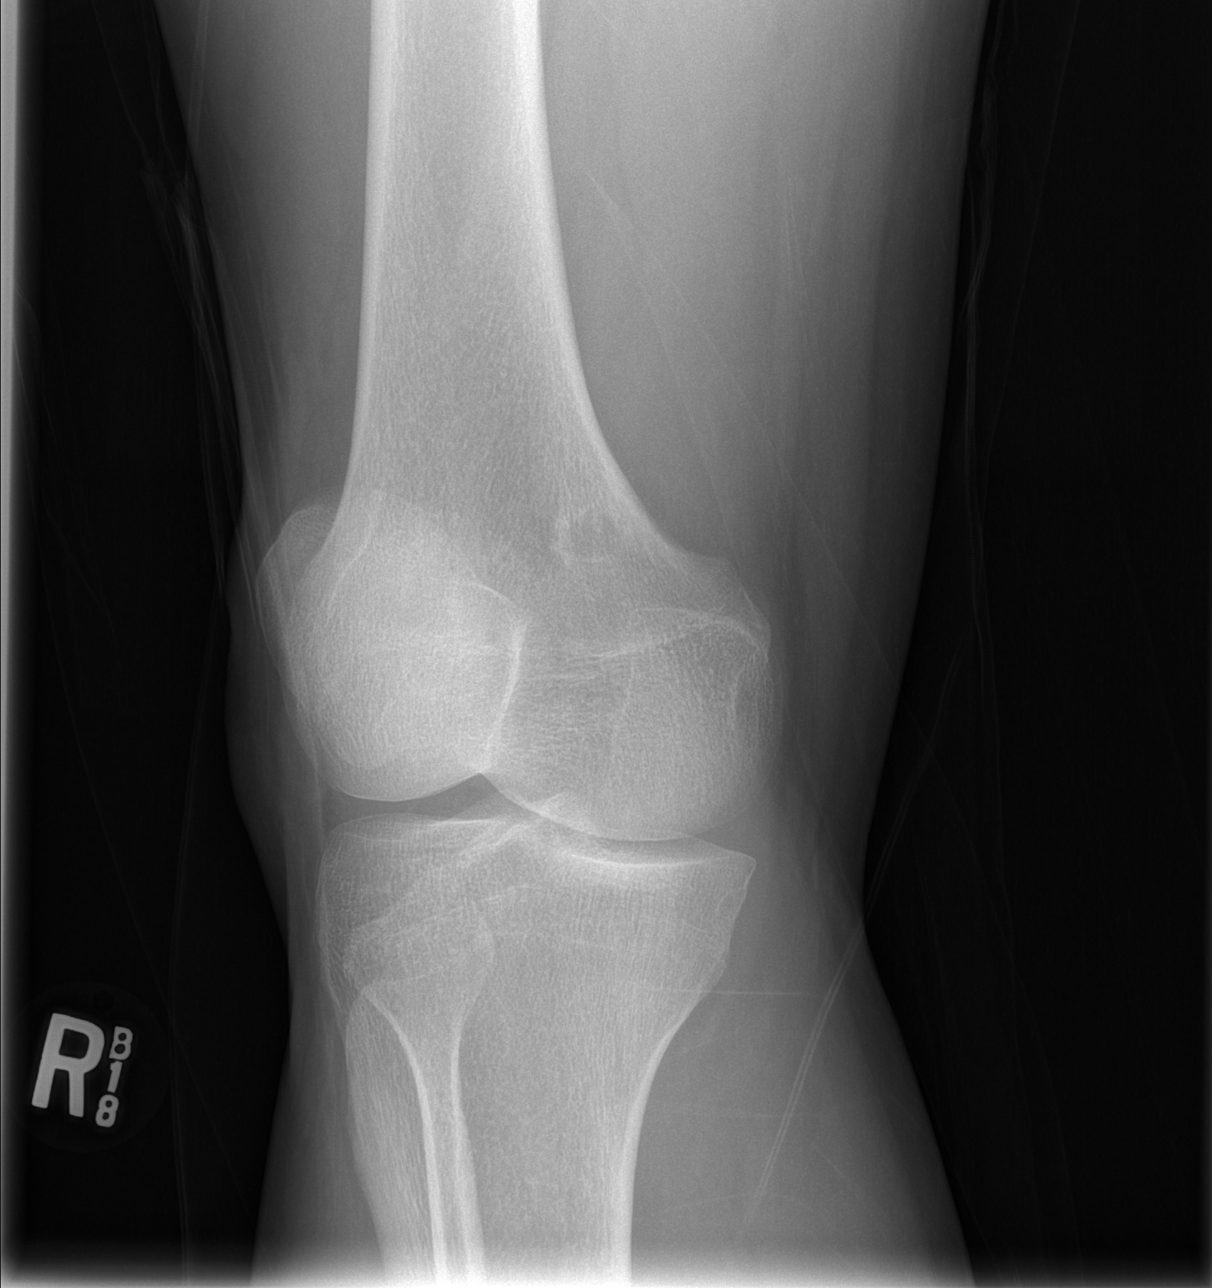

[t knee oblique right (2 of 2)]
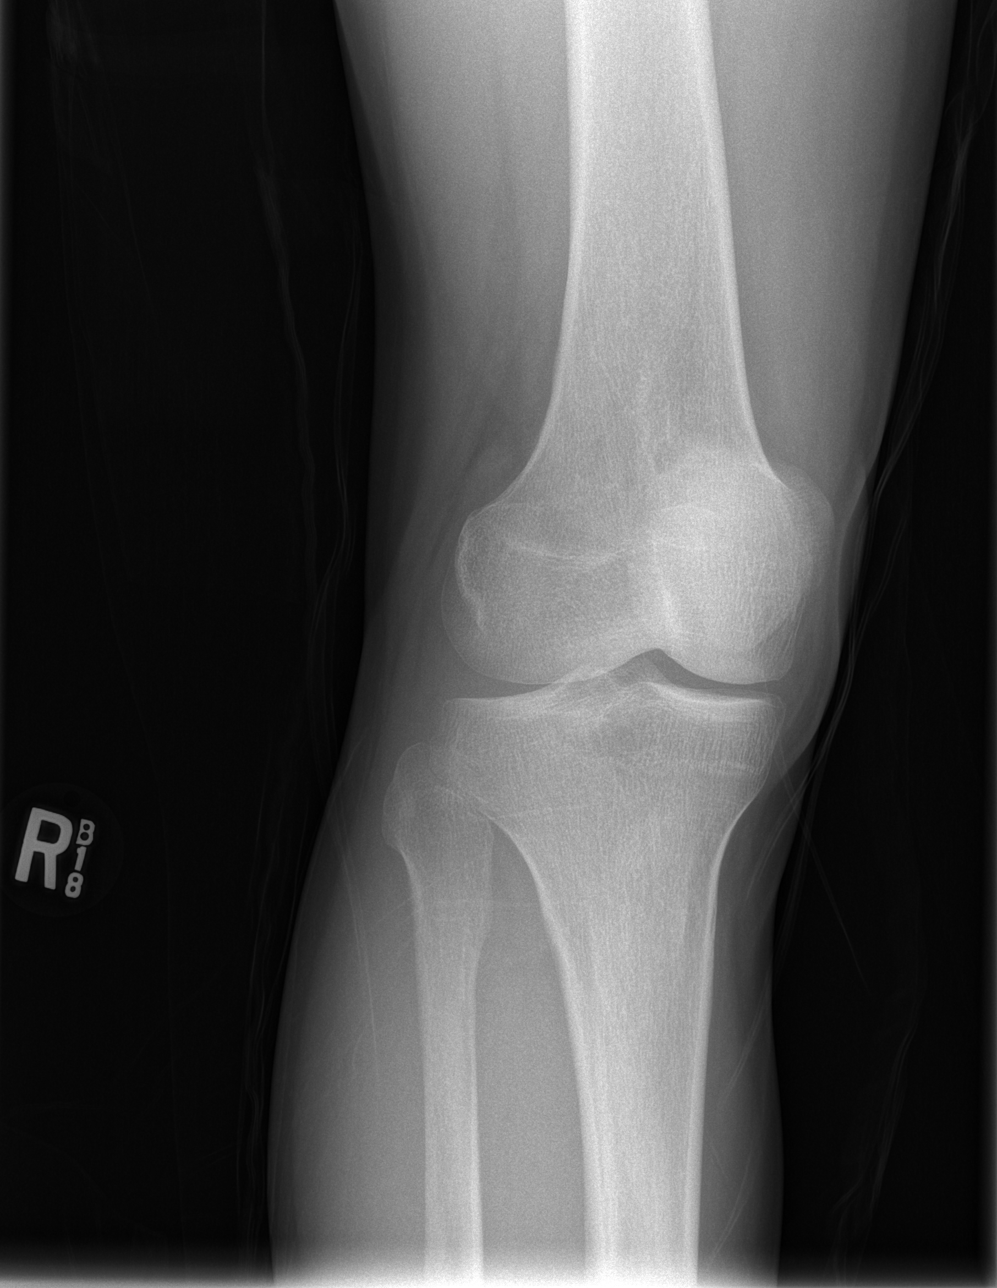

[t knee lat right]
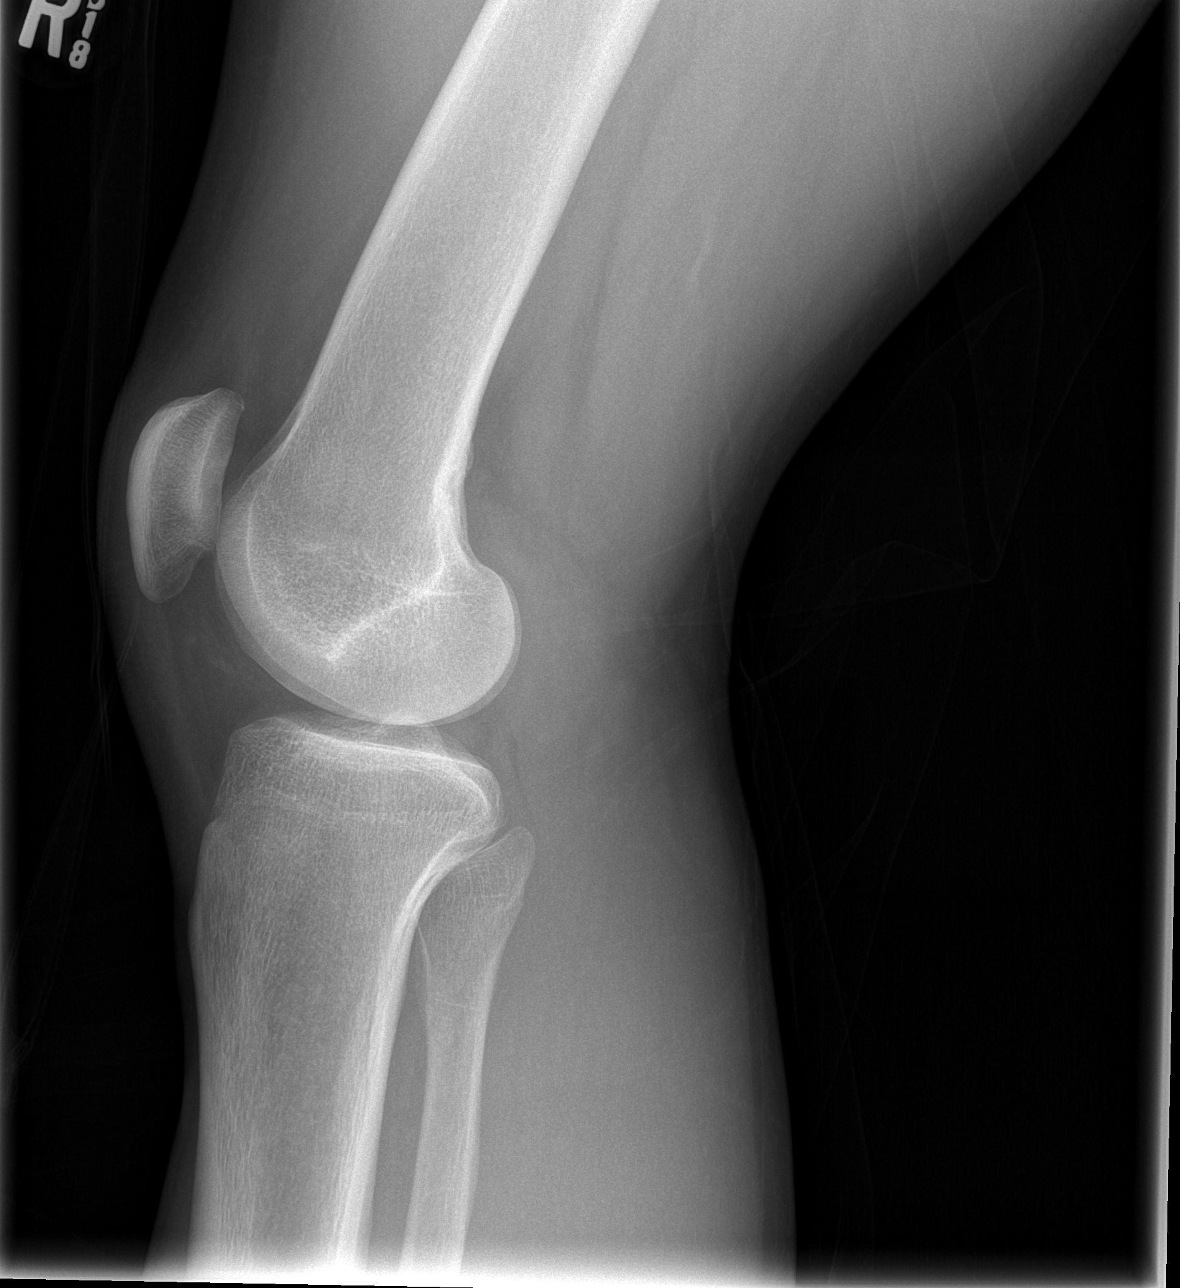

[4 of 4 positions shown; findings below may reference images not displayed]

FINDINGS: The joint spaces are maintained. No fracture or osteochondral
lesion. Remote healed fibrous cortical lesion noted in the femoral
metaphysis. No definite joint effusion.
IMPRESSION: No acute bony findings, degenerative changes or joint effusion.

## 2018-10-10 ENCOUNTER — Other Ambulatory Visit: Payer: Self-pay

## 2018-10-10 ENCOUNTER — Encounter (HOSPITAL_COMMUNITY): Payer: Self-pay | Admitting: Emergency Medicine

## 2018-10-10 ENCOUNTER — Ambulatory Visit (HOSPITAL_COMMUNITY)
Admission: EM | Admit: 2018-10-10 | Discharge: 2018-10-10 | Disposition: A | Discharge status: Home / Self Care | Payer: 59 | Attending: Family Medicine | Admitting: Family Medicine

## 2018-10-10 ENCOUNTER — Ambulatory Visit (INDEPENDENT_AMBULATORY_CARE_PROVIDER_SITE_OTHER): Payer: 59

## 2018-10-10 DIAGNOSIS — R070 Pain in throat: Secondary | ICD-10-CM

## 2018-10-10 DIAGNOSIS — R1310 Dysphagia, unspecified: Secondary | ICD-10-CM | POA: Diagnosis not present

## 2018-10-10 DIAGNOSIS — T17200A Unspecified foreign body in pharynx causing asphyxiation, initial encounter: Secondary | ICD-10-CM | POA: Diagnosis not present

## 2018-10-10 MED ORDER — LIDOCAINE VISCOUS HCL 2 % MT SOLN: 15.0000 mL | OROMUCOSAL | 0 refills | Status: DC | PRN

## 2018-10-10 NOTE — ED Triage Notes (Signed)
After eating chicken wings, noticed when swallowing saliva, felt something on right side of throat.  feels like a thorn poking him with a particularly sharp pain.  This was noticed yesterday, symptoms continued today.    No difficulty breathing, eating or drinking.

## 2018-10-10 NOTE — ED Provider Notes (Signed)
[facility]    CSN: [id number] Arrival date & time: 10/10/18  1111     History   Chief Complaint Chief Complaint  Patient presents with  . Sore Throat    HPI Brian [name] is a 37 y.o. male history of diverticulitis presenting today for evaluation of possible foreign body in throat.  Patient states that yesterday he started to develop a sharp poking sensation in the right side of his upper throat.  States that he ate chicken wings prior to this, but never felt a bone or specific swallowing of something sharp.  Since the pain has persisted.  Describes it as a thorn poking especially with swallowing.  Denies associated URI symptoms of congestion or coughing.  Denies difficulty breathing or shortness of breath.  Feel sensation with turning head specific directions.  HPI  Past Medical History:  Diagnosis Date  . Diverticulitis 2017    Patient Active Problem List   Diagnosis Date Noted  . Diverticulitis of colon 10/28/2016  . Injury of right hand 04/19/2012    History reviewed. No pertinent surgical history.     Home Medications    Prior to Admission medications   Medication Sig Start Date End Date Taking? Authorizing Provider  EPINEPHrine (EPIPEN) 0.3 mg/0.3 mL DEVI Inject 0.3 mg into the muscle once.    [provider]  lidocaine (XYLOCAINE) 2 % solution Use as directed 15 mLs in the mouth or throat every 4 (four) hours as needed for mouth pain. 10/10/18   Wieters, Elesa Hacker, PA-C    Family History Family History  Problem Relation Age of Onset  . Heart attack Father   . Diabetes Neg Hx   . Hyperlipidemia Neg Hx   . Hypertension Neg Hx   . Sudden death Neg Hx     Social History Social History   Tobacco Use  . Smoking status: Never Smoker  . Smokeless tobacco: Never Used  Substance Use Topics  . Alcohol use: Yes    Alcohol/week: 8.0 standard drinks    Types: 8 Cans of beer per week  . Drug use: No     Allergies   Bee  venom   Review of Systems Review of Systems  Constitutional: Negative for activity change, appetite change, chills, fatigue and fever.  HENT: Positive for sore throat and trouble swallowing. Negative for congestion, ear pain, rhinorrhea and sinus pressure.   Eyes: Negative for discharge and redness.  Respiratory: Negative for cough, chest tightness and shortness of breath.   Cardiovascular: Negative for chest pain.  Gastrointestinal: Negative for abdominal pain, diarrhea, nausea and vomiting.  Musculoskeletal: Negative for myalgias.  Skin: Negative for rash.  Neurological: Negative for dizziness, light-headedness and headaches.     Physical Exam Triage Vital Signs ED Triage Vitals  Enc Vitals Group     BP 10/10/18 1127 129/75     Pulse Rate 10/10/18 1127 70     Resp 10/10/18 1127 16     Temp 10/10/18 1127 97.7 F (36.5 C)     Temp Source 10/10/18 1127 Oral     SpO2 10/10/18 1127 99 %     Weight --      Height --      Head Circumference --      Peak Flow --      Pain Score 10/10/18 1130 6     Pain Loc --      Pain Edu? --      Excl. in [place]? --  No data found.  Updated Vital Signs BP 129/75 (BP Location: Left Arm)   Pulse 70   Temp 97.7 F (36.5 C) (Oral)   Resp 16   SpO2 99%   Visual Acuity Right Eye Distance:   Left Eye Distance:   Bilateral Distance:    Right Eye Near:   Left Eye Near:    Bilateral Near:     Physical Exam  Constitutional: He appears well-developed and well-nourished.  HENT:  Head: Normocephalic and atraumatic.  Oral mucosa pink and moist, no tonsillar enlargement or exudate. Posterior pharynx patent and nonerythematous, no uvula deviation or swelling. Normal phonation.  Eyes: Conjunctivae are normal.  Neck: Normal range of motion. Neck supple.  Full active range of motion of neck, tenderness and reproducing of stabbing pain with palpation to right upper tonsillar area, no lymphadenopathy in this area  Cardiovascular: Normal rate and  regular rhythm.  No murmur heard. Pulmonary/Chest: Effort normal and breath sounds normal. No respiratory distress.  Breathing comfortably at rest, CTABL, no wheezing, rales or other adventitious sounds auscultated  Abdominal: Soft. There is no tenderness.  Musculoskeletal: He exhibits no edema.  Neurological: He is alert.  Skin: Skin is warm and dry.  Psychiatric: He has a normal mood and affect.  Nursing note and vitals reviewed.    UC Treatments / Results  Labs (all labs ordered are listed, but only abnormal results are displayed) Labs Reviewed - No data to display  EKG None  Radiology Dg Neck Soft Tissue  Result Date: 10/10/2018 CLINICAL DATA:  Foreign body sensation right tonsillar region of the through, possible chicken bone EXAM: NECK SOFT TISSUES - 1+ VIEW COMPARISON:  None. FINDINGS: The cervical vertebrae are in alignment. Intervertebral disc spaces appear normal. No acute abnormality is seen. The hypopharynx is unremarkable. No abnormality of the epiglottis is noted by plain film. No opaque body is seen. IMPRESSION: No opaque body.  Normal alignment of the cervical vertebrae. Electronically Signed   By: Ivar Drape M.D.   On: 10/10/2018 12:05    Procedures Procedures (including critical care time)  Medications Ordered in UC Medications - No data to display  Initial Impression / Assessment and Plan / UC Course  I have reviewed the triage vital signs and the nursing notes.  Pertinent labs & imaging results that were available during my care of the patient were reviewed by me and considered in my medical decision making (see chart for details).     X-ray negative for foreign body.  Will have patient use viscous lidocaine to treat discomfort, follow-up with ENT if symptoms not resolving.  Discussed possible scratch in esophagus lining.  Airway patent and maintained, no difficulty breathing.  Return if symptoms worsening or developing difficulty breathing or shortness  of breath.Discussed strict return precautions. Patient verbalized understanding and is agreeable with plan.  Final Clinical Impressions(s) / UC Diagnoses   Final diagnoses:  Throat discomfort     Discharge Instructions     No foreign body seen on xray Use lidocaine as needed every 4 hours to relieve discomfort If symptoms persisting without improvement please follow up with [facility] ENT  Return here or ED if developing difficulty breathing or shortness of breath    ED Prescriptions    Medication Sig Dispense Auth. Provider   lidocaine (XYLOCAINE) 2 % solution Use as directed 15 mLs in the mouth or throat every 4 (four) hours as needed for mouth pain. 100 mL Wieters, Clarendon C, PA-C  Controlled Substance Prescriptions  Controlled Substance Registry consulted? Not Applicable   Janith Lima, [place] 10/10/18 1214

## 2018-10-10 NOTE — Discharge Instructions (Addendum)
No foreign body seen on xray Use lidocaine as needed every 4 hours to relieve discomfort If symptoms persisting without improvement please follow up with [facility] ENT  Return here or ED if developing difficulty breathing or shortness of breath

## 2018-11-01 MED FILL — predniSONE 10 MG TABS: 10 | 10 days supply | Qty: 21 | Fill #0

## 2019-08-31 MED FILL — predniSONE 10 MG TABS: 10 | 12 days supply | Qty: 20 | Fill #0

## 2019-09-05 ENCOUNTER — Other Ambulatory Visit: Payer: Self-pay

## 2019-09-05 DIAGNOSIS — Z20822 Contact with and (suspected) exposure to covid-19: Secondary | ICD-10-CM

## 2019-09-05 NOTE — Progress Notes (Deleted)
[place] at [facility] [address], [place], [place] [address] [phone]  Date:  09/06/2019   Name:  Brian [name]   DOB:  1981-05-03   MRN:  [id number]  PCP:  Darreld Mclean, MD    Chief Complaint: No chief complaint on file.   History of Present Illness:  Brian [name] is a 38 y.o. very pleasant male patient who presents with the following:  Office visit today to discuss mental health concerns.  I last saw this patient about 2 years ago for knee pain  He is married, they have 2 young children His son is 63, daughter is 2 or 3.  She was born with adrenal insufficiency  Tdap Flu vaccine Routine labs Patient Active Problem List   Diagnosis Date Noted  . Diverticulitis of colon 10/28/2016  . Injury of right hand 04/19/2012    Past Medical History:  Diagnosis Date  . Diverticulitis 2017    No past surgical history on file.  Social History   Tobacco Use  . Smoking status: Never Smoker  . Smokeless tobacco: Never Used  Substance Use Topics  . Alcohol use: Yes    Alcohol/week: 8.0 standard drinks    Types: 8 Cans of beer per week  . Drug use: No    Family History  Problem Relation Age of Onset  . Heart attack Father   . Diabetes Neg Hx   . Hyperlipidemia Neg Hx   . Hypertension Neg Hx   . Sudden death Neg Hx     Allergies  Allergen Reactions  . Bee Venom Anaphylaxis    Medication list has been reviewed and updated.  Current Outpatient Medications on File Prior to Visit  Medication Sig Dispense Refill  . EPINEPHrine (EPIPEN) 0.3 mg/0.3 mL DEVI Inject 0.3 mg into the muscle once.    . lidocaine (XYLOCAINE) 2 % solution Use as directed 15 mLs in the mouth or throat every 4 (four) hours as needed for mouth pain. 100 mL 0   No current facility-administered medications on file prior to visit.     Review of Systems:  As per HPI- otherwise negative.   Physical Examination: There were no vitals  filed for this visit. There were no vitals filed for this visit. There is no height or weight on file to calculate BMI. Ideal Body Weight:    GEN: WDWN, NAD, Non-toxic, A & O x 3 HEENT: Atraumatic, Normocephalic. Neck supple. No masses, No LAD. Ears and Nose: No external deformity. CV: RRR, No M/G/R. No JVD. No thrill. No extra heart sounds. PULM: CTA B, no wheezes, crackles, rhonchi. No retractions. No resp. distress. No accessory muscle use. ABD: S, NT, ND, +BS. No rebound. No HSM. EXTR: No c/c/e NEURO Normal gait.  PSYCH: Normally interactive. Conversant. Not depressed or anxious appearing.  Calm demeanor.    Assessment and Plan: ***  Signed Lamar Blinks, MD

## 2019-09-06 ENCOUNTER — Ambulatory Visit: Payer: 59 | Admitting: Family Medicine

## 2019-09-06 NOTE — Progress Notes (Addendum)
[place] at [facility] [place], [place], [place] [address] [phone]  Date:  09/10/2019   Name:  Brian [name]   DOB:  May 19, 1981   MRN:  [id number]  PCP:  Darreld Mclean, MD    Chief Complaint: Mental Health Concerns (flu shot? )   History of Present Illness:  Brian [name] is a 38 y.o. very pleasant male patient who presents with the following:  Here today for an office visit to discuss concerns about mental health I last saw him about 2 years ago for knee pain  Melinda is generally in good health, he does have history of diverticulitis He notes today "issues that I have been fighting for a long time but was afraid to talk about" His wife is in the medical field- I believe she is a PA- and has encouraged him to seek care and start on medication.   They have been married for 8 years   He notes an issue with anger, depression and anxiety He is getting more angry and having trouble controlling himself around his son.  He is at an age where he is more defiant and [name] finds himself just having to walk away form him sometimes so he won't be physical He is just very stressed, knows that something has to change for his marriage to survive.  He and his wife are planning to do couples counseling  His son is 53 and daughter is 58 yo.  Thankfully his son is attending in-person school   He is not sleeping well Appetite is ok- he has gained some weight   Flu shot- today  Tdap- today Labs- today He is fasting this am   He is not sure about history of depression or anxiety in his family.  However his father did have excessive anger and got therapy after his parents got divorced He notes that they don't really talk about these things in his family   He has just started going back to the gym which might be helping him No SI   He does use alcohol- maybe 2-5 drinks per night.  He does not feel like this is a big issue for him,  does not interfere with his life as far as he can tell  He also cut his left index finger on a knife yesterday.  His wife treated it with steri- strips and he thinks is ok, but he needs a tetanus booster   Wt Readings from Last 3 Encounters:  09/10/19 199 lb (90.3 kg)  09/05/17 195 lb 12.8 oz (88.8 kg)  12/10/16 185 lb (83.9 kg)     Patient Active Problem List   Diagnosis Date Noted  . Diverticulitis of colon 10/28/2016  . Injury of right hand 04/19/2012    Past Medical History:  Diagnosis Date  . Diverticulitis 2017    No past surgical history on file.  Social History   Tobacco Use  . Smoking status: Never [name]  . Smokeless tobacco: Never Used  Substance Use Topics  . Alcohol use: Yes    Alcohol/week: 8.0 standard drinks    Types: 8 Cans of beer per week  . Drug use: No    Family History  Problem Relation Age of Onset  . Heart attack Father   . Diabetes Neg Hx   . Hyperlipidemia Neg Hx   . Hypertension Neg Hx   . Sudden death Neg Hx     Allergies  Allergen Reactions  . Bee Venom Anaphylaxis    Medication list has been reviewed and updated.  Current Outpatient Medications on File Prior to Visit  Medication Sig Dispense Refill  . EPINEPHrine (EPIPEN) 0.3 mg/0.3 mL DEVI Inject 0.3 mg into the muscle once.     No current facility-administered medications on file prior to visit.     Review of Systems:  As per HPI- otherwise negative. No fever or chills  Physical Examination: Vitals:   09/10/19 0835  BP: 110/72  Pulse: 75  Resp: 16  Temp: 98 F (36.7 C)  SpO2: 99%   Vitals:   09/10/19 0835  Weight: 199 lb (90.3 kg)  Height: 5\' 10"  (1.778 m)   Body mass index is 28.55 kg/m. Ideal Body Weight: Weight in (lb) to have BMI = 25: 173.9  GEN: WDWN, NAD, Non-toxic, A & O x 3, looks well, overweight HEENT: Atraumatic, Normocephalic. Neck supple. No masses, No LAD. Ears and Nose: No external deformity. CV: RRR, No M/G/R. No JVD. No thrill. No  extra heart sounds. PULM: CTA B, no wheezes, crackles, rhonchi. No retractions. No resp. distress. No accessory muscle use.Marland Kitchen EXTR: No c/c/e NEURO Normal gait.  PSYCH: Normally interactive. Conversant. Not depressed or anxious appearing.  Calm demeanor.  Bandage on his right index finger,  He declines an exam of his wound    Assessment and Plan: Adjustment disorder with mixed anxiety and depressed mood - Plan: venlafaxine XR (EFFEXOR XR) 75 MG 24 hr capsule  Screening for deficiency anemia - Plan: CBC  Screening for diabetes mellitus - Plan: Comprehensive metabolic panel, Hemoglobin A1c  Screening for hyperlipidemia - Plan: Lipid panel  Immunization due - Plan: Tdap vaccine greater than or equal to 7yo IM  Stress due to family tension - Plan: venlafaxine XR (EFFEXOR XR) 75 MG 24 hr capsule  Weight gain - Plan: TSH  Here today to discuss some personal issues- anxiety, depression, anger We discussed his concerns in detail.  Will start on effexor xr 75 mg Discussed black box warning and potential SE  Encouraged him to cut down on alcohol and he plans to do so He also plans to attend counseling Asked him to update me via mychart in the coming weeks and to recheck in 6-8 weeks  Routine labs pending Flu and tetanus vaccines given today   Signed Lamar Blinks, MD  Received his labs, message to patient  Results for orders placed or performed in visit on 09/10/19  CBC  Result Value Ref Range   WBC 6.6 4.0 - 10.5 K/uL   RBC 5.23 4.22 - 5.81 Mil/uL   Platelets 229.0 150.0 - 400.0 K/uL   Hemoglobin 15.2 13.0 - 17.0 g/dL   HCT 45.1 39.0 - 52.0 %   MCV 86.1 78.0 - 100.0 fl   MCHC 33.8 30.0 - 36.0 g/dL   RDW 13.3 11.5 - 15.5 %  Comprehensive metabolic panel  Result Value Ref Range   Sodium 142 135 - 145 mEq/L   Potassium 4.4 3.5 - 5.1 mEq/L   Chloride 107 96 - 112 mEq/L   CO2 27 19 - 32 mEq/L   Glucose, Bld 97 70 - 99 mg/dL   BUN 14 6 - 23 mg/dL   Creatinine, Ser 1.09 0.40  - 1.50 mg/dL   Total Bilirubin 0.7 0.2 - 1.2 mg/dL   Alkaline Phosphatase 67 39 - 117 U/L   AST 17 0 - 37 U/L   ALT 20 0 - 53 U/L  Total Protein 6.7 6.0 - 8.3 g/dL   Albumin 4.8 3.5 - 5.2 g/dL   Calcium 9.5 8.4 - 10.5 mg/dL   GFR 75.03 >60.00 mL/min  Hemoglobin A1c  Result Value Ref Range   Hgb A1c MFr Bld 5.4 4.6 - 6.5 %  Lipid panel  Result Value Ref Range   Cholesterol 183 0 - 200 mg/dL   Triglycerides 125.0 0.0 - 149.0 mg/dL   HDL 41.70 >39.00 mg/dL   VLDL 25.0 0.0 - 40.0 mg/dL   LDL Cholesterol 117 (H) 0 - 99 mg/dL   Total CHOL/HDL Ratio 4    NonHDL 141.58   TSH  Result Value Ref Range   TSH 1.30 0.35 - 4.50 uIU/mL

## 2019-09-07 ENCOUNTER — Other Ambulatory Visit: Payer: Self-pay

## 2019-09-07 LAB — NOVEL CORONAVIRUS, NAA: SARS-CoV-2, NAA: NOT DETECTED

## 2019-09-10 ENCOUNTER — Encounter: Payer: Self-pay | Admitting: Family Medicine

## 2019-09-10 ENCOUNTER — Ambulatory Visit: Payer: 59 | Admitting: Family Medicine

## 2019-09-10 ENCOUNTER — Other Ambulatory Visit: Payer: Self-pay

## 2019-09-10 VITALS — BP 110/72 | HR 75 | Temp 98.0°F | Resp 16 | Ht 70.0 in | Wt 199.0 lb

## 2019-09-10 DIAGNOSIS — F4323 Adjustment disorder with mixed anxiety and depressed mood: Secondary | ICD-10-CM | POA: Diagnosis not present

## 2019-09-10 DIAGNOSIS — Z131 Encounter for screening for diabetes mellitus: Secondary | ICD-10-CM

## 2019-09-10 DIAGNOSIS — Z13220 Encounter for screening for lipoid disorders: Secondary | ICD-10-CM | POA: Diagnosis not present

## 2019-09-10 DIAGNOSIS — Z130 Encounter for screening for diseases of the blood and blood-forming organs and certain disorders involving the immune mechanism: Secondary | ICD-10-CM | POA: Diagnosis not present

## 2019-09-10 DIAGNOSIS — Z638 Other specified problems related to primary support group: Secondary | ICD-10-CM

## 2019-09-10 DIAGNOSIS — Z23 Encounter for immunization: Secondary | ICD-10-CM | POA: Diagnosis not present

## 2019-09-10 DIAGNOSIS — R635 Abnormal weight gain: Secondary | ICD-10-CM | POA: Diagnosis not present

## 2019-09-10 LAB — LIPID PANEL
Cholesterol: 183 mg/dL (ref 0–200)
HDL: 41.7 mg/dL (ref >39.00)
LDL Cholesterol: 117 mg/dL — ABNORMAL HIGH (ref 0–99)
NonHDL: 141.58
Total CHOL/HDL Ratio: 4
Triglycerides: 125 mg/dL (ref 0.0–149.0)
VLDL: 25 mg/dL (ref 0.0–40.0)

## 2019-09-10 LAB — TSH: TSH: 1.3 u[IU]/mL (ref 0.35–4.50)

## 2019-09-10 LAB — COMPREHENSIVE METABOLIC PANEL
ALT: 20 U/L (ref 0–53)
AST: 17 U/L (ref 0–37)
Albumin: 4.8 g/dL (ref 3.5–5.2)
Alkaline Phosphatase: 67 U/L (ref 39–117)
BUN: 14 mg/dL (ref 6–23)
CO2: 27 mEq/L (ref 19–32)
Calcium: 9.5 mg/dL (ref 8.4–10.5)
Chloride: 107 mEq/L (ref 96–112)
Creatinine, Ser: 1.09 mg/dL (ref 0.40–1.50)
GFR: 75.03 mL/min (ref >60.00)
Glucose, Bld: 97 mg/dL (ref 70–99)
Potassium: 4.4 mEq/L (ref 3.5–5.1)
Sodium: 142 mEq/L (ref 135–145)
Total Bilirubin: 0.7 mg/dL (ref 0.2–1.2)
Total Protein: 6.7 g/dL (ref 6.0–8.3)

## 2019-09-10 LAB — CBC
HCT: 45.1 % (ref 39.0–52.0)
Hemoglobin: 15.2 g/dL (ref 13.0–17.0)
MCHC: 33.8 g/dL (ref 30.0–36.0)
MCV: 86.1 fl (ref 78.0–100.0)
Platelets: 229 10*3/uL (ref 150.0–400.0)
RBC: 5.23 Mil/uL (ref 4.22–5.81)
RDW: 13.3 % (ref 11.5–15.5)
WBC: 6.6 10*3/uL (ref 4.0–10.5)

## 2019-09-10 LAB — HEMOGLOBIN A1C: Hgb A1c MFr Bld: 5.4 % (ref 4.6–6.5)

## 2019-09-10 MED ORDER — VENLAFAXINE HCL ER 75 MG PO CP24: 75.0000 mg | ORAL_CAPSULE | Freq: Every day | ORAL | 6 refills | Status: DC

## 2019-09-10 MED FILL — VENLAFAXINE HCL ER 75 MG CA: 75 | 30 days supply | Qty: 30 | Fill #0

## 2019-09-10 NOTE — Patient Instructions (Addendum)
It was great to see you again today!  I am sorry that you are going through a hard time, but appreciate your willingness to came and talk to me about your concerns.  I am hopeful that you will be feeling better soon!  Flu shot and tetanus booster today I will be in touch with your routine labs asap   I would suggest that you limit alcohol intake - alcohol is likely interfering with your sleep and may actually increase symptoms of depression and anxiety  We will start you on effexor xr 75 mg once a day Please let me know how this is working for you over the next few weeks via mychart  Please visit with me- in person or virtual- in 6-8 weeks to check on how your are doing

## 2019-10-08 MED FILL — VENLAFAXINE HCL ER 75 MG CA: 75 | 30 days supply | Qty: 30 | Fill #1

## 2019-10-12 ENCOUNTER — Encounter: Payer: Self-pay | Admitting: Family Medicine

## 2019-10-12 DIAGNOSIS — F4323 Adjustment disorder with mixed anxiety and depressed mood: Secondary | ICD-10-CM

## 2019-10-12 DIAGNOSIS — Z638 Other specified problems related to primary support group: Secondary | ICD-10-CM

## 2019-10-12 MED ORDER — VENLAFAXINE HCL ER 150 MG PO CP24: 150.0000 mg | ORAL_CAPSULE | Freq: Every day | ORAL | 5 refills | Status: DC

## 2019-10-12 NOTE — Addendum Note (Signed)
Addended by: Lamar Blinks C on: 10/12/2019 01:10 PM   Modules accepted: Orders

## 2019-10-21 ENCOUNTER — Encounter: Payer: Self-pay | Admitting: Family Medicine
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
## 2019-10-22 ENCOUNTER — Other Ambulatory Visit: Payer: Self-pay

## 2019-10-22 ENCOUNTER — Encounter: Payer: Self-pay | Admitting: Family Medicine

## 2019-10-22 ENCOUNTER — Ambulatory Visit: Payer: 59 | Admitting: Family Medicine

## 2019-10-22 VITALS — BP 122/82 | HR 75 | Temp 96.7°F | Resp 17 | Ht 70.0 in | Wt 198.0 lb

## 2019-10-22 DIAGNOSIS — Z638 Other specified problems related to primary support group: Secondary | ICD-10-CM

## 2019-10-22 DIAGNOSIS — F4323 Adjustment disorder with mixed anxiety and depressed mood: Secondary | ICD-10-CM | POA: Diagnosis not present

## 2019-10-22 DIAGNOSIS — H9190 Unspecified hearing loss, unspecified ear: Secondary | ICD-10-CM

## 2019-10-22 DIAGNOSIS — Z91030 Bee allergy status: Secondary | ICD-10-CM

## 2019-10-22 MED ORDER — EPINEPHRINE 0.3 MG/0.3ML IJ SOAJ: 0.3000 mg | INTRAMUSCULAR | 99 refills | Status: DC | PRN

## 2019-10-22 NOTE — Patient Instructions (Signed)
Great to see you today- I am glad the 150 effexor is working well Let's plan to treat for 6- 12 months and then try coming off if you like.  Also ok to continue long term if you prefer   Please try an OTC nasal steroid like Flonase for 2-3 weeks- if this does not fix your hearing issue I would suggest a formal hearing test with an audiologist

## 2019-12-31 MED FILL — VENLAFAXINE HCL ER 150 MG C: 150 | 30 days supply | Qty: 30 | Fill #0

## 2020-02-14 MED FILL — TACROLIMUS 0.1 % OINT: 0.1 | 15 days supply | Qty: 30 | Fill #0

## 2020-02-14 MED FILL — VENLAFAXINE HCL ER 150 MG C: 150 | 30 days supply | Qty: 30 | Fill #1

## 2020-03-03 MED FILL — TACROLIMUS 0.1 % OINT: 0.1 | 15 days supply | Qty: 30 | Fill #0

## 2020-03-18 MED FILL — VENLAFAXINE HCL ER 150 MG C: 150 | 30 days supply | Qty: 30 | Fill #2

## 2020-04-22 ENCOUNTER — Other Ambulatory Visit (INDEPENDENT_AMBULATORY_CARE_PROVIDER_SITE_OTHER): Payer: 59

## 2020-04-22 ENCOUNTER — Encounter: Payer: Self-pay | Admitting: Medical

## 2020-04-22 ENCOUNTER — Telehealth (INDEPENDENT_AMBULATORY_CARE_PROVIDER_SITE_OTHER): Payer: 59 | Admitting: Medical

## 2020-04-22 DIAGNOSIS — R42 Dizziness and giddiness: Secondary | ICD-10-CM | POA: Diagnosis not present

## 2020-04-22 DIAGNOSIS — R5383 Other fatigue: Secondary | ICD-10-CM

## 2020-04-22 DIAGNOSIS — Z1152 Encounter for screening for COVID-19: Secondary | ICD-10-CM | POA: Diagnosis not present

## 2020-04-22 LAB — CBC WITH DIFFERENTIAL/PLATELET
Basophils Absolute: 0 10*3/uL (ref 0.0–0.1)
Basophils Relative: 0.4 % (ref 0.0–3.0)
Eosinophils Absolute: 0.1 10*3/uL (ref 0.0–0.7)
Eosinophils Relative: 1.2 % (ref 0.0–5.0)
HCT: 44 % (ref 39.0–52.0)
Hemoglobin: 15.3 g/dL (ref 13.0–17.0)
Lymphocytes Relative: 19.5 % (ref 12.0–46.0)
Lymphs Abs: 1.7 10*3/uL (ref 0.7–4.0)
MCHC: 34.9 g/dL (ref 30.0–36.0)
MCV: 86.5 fl (ref 78.0–100.0)
Monocytes Absolute: 0.9 10*3/uL (ref 0.1–1.0)
Monocytes Relative: 10.2 % (ref 3.0–12.0)
Neutro Abs: 6.2 10*3/uL (ref 1.4–7.7)
Neutrophils Relative %: 68.7 % (ref 43.0–77.0)
Platelets: 213 10*3/uL (ref 150.0–400.0)
RBC: 5.08 Mil/uL (ref 4.22–5.81)
RDW: 13 % (ref 11.5–15.5)
WBC: 9 10*3/uL (ref 4.0–10.5)

## 2020-04-22 MED ORDER — MECLIZINE HCL 12.5 MG PO TABS: ORAL_TABLET | ORAL | 0 refills | Status: DC

## 2020-04-22 MED FILL — MECLIZINE 12.5 MG CAPLET: 12.5 | 33 days supply | Qty: 100 | Fill #0

## 2020-04-22 NOTE — Patient Instructions (Addendum)
On and off dizziness since Sunday. We discussed challenges and difficulty doing virtual visit as opposed to in person. Also no vital signs so asked please have wife check bp and pulse and update me on my chart bp and pulse readings as this is very important in work up.  We got cbc, cmp, tsh, b12, b1 and vit D level today.  Possible dehydration as you and your wife mentioned. Labs will be helpful in determining this. Continue to hydrate well.  Rx meclizine for dizziness. Rx advisment.  For left shoulder pain see ortho. If in office visit would have considered ekg since report sometimes shoulder hurts even without movement.  Follow up date to be determined after lab review.  Would recommend next visit be in person due to nature of complaints.  If any severe dizziness with any associated neurologic or cardiac symptomsm then recommend ED evaluation.

## 2020-04-22 NOTE — Progress Notes (Signed)
Subjective:    Patient ID: Brian [name], male    DOB: 1981-02-25, 39 y.o.   MRN: [id number]  HPI Virtual Visit via Video Note  I connected with Brian [name] on 04/22/20 at 11:20 AM EDT by a video enabled telemedicine application and verified that I am speaking with the correct person using two identifiers.  Location: Patient: home Provider: office  Pt has bp cuff at home .indicates manual cuff and has no idea how to work but wife is NP and she can check when he is at home.  Pt scheduled virtual visit since he could not find convenient time today in light of his work schedule to come in. He had required meeting.   I discussed the limitations of evaluation and management by telemedicine and the availability of in person appointments. The patient expressed understanding and agreed to proceed.  History of Present Illness:   Pt states hard to explain how he feels. He woke up on monday in sweats. He woke up lethargic. He felt dizzy and tingly. No ha or blurred vison. No gross motor or sensory function deficits.   Pt wife is NP. Pt drank pedialyte, gatorade and water. He hydrated all day yesterday. He did a lot of yard work Friday and Saturday. But indicates that working in yard is not unusual for him.  Onset of slight dizziness was Sunday night. No fever, no chills or sweats. covid vaccine one month ago and no effects. No urinary symptoms. No nasal congestion, no cough, no sob, no chest pain, no myalgia and no loss of smell.   Pt went to sleep last night at 6 pm and woke up at 6 am.  Feels light headed but no vertigo. No gross motor or sensory function deficits.   Pt been having shoulder pain. Pt has constant pain. Even without movement. But pain is worse with movement. Pt states pain in shoulder for one month. No chest pain, no sob, no jaw pain.    Observations/Objective: General-no acute distress, pleasant, oriented. Lungs- on inspection lungs appear unlabored. Neck- no  tracheal deviation or jvd on inspection. Neuro- gross motor function appears intact.finger to nose intact, no hand drift, negative romberg. Heal to toe gait intact. Symmetric smile. eom intact. No nystagmus visualized.  Assessment and Plan: On and off dizziness since Sunday. We discussed challenges and difficulty doing virtual visit as opposed to in person. Also no vital signs so asked please have wife check bp and pulse and update me on my chart bp and pulse readings as this is very important in work up.  We got cbc, cmp, tsh, b12, b1 and vit D level today.  Possible dehydration as you and your wife mentioned. Labs will be helpful in determining this. Continue to hydrate well.  Rx meclizine for dizziness. Rx advisment.  For left shoulder pain see ortho. If in office visit would have considered ekg since report sometimes shoulder hurts even without movement.  Follow up date to be determined after lab review.  Would recommend next visit be in person due to nature of complaints.  If any severe dizziness with any associated neurologic or cardiac symptomsm then recommend ED evaluation.  Time spent with patient today was 30  minutes which consisted of chart review, discussing diagnosis, work up, treatment, counseling of plan going forward and documentation.  Follow Up Instructions:    I discussed the assessment and treatment plan with the patient. The patient was provided an opportunity to ask questions and all were  answered. The patient agreed with the plan and demonstrated an understanding of the instructions.   The patient was advised to call back or seek an in-person evaluation if the symptoms worsen or if the condition fails to improve as anticipated.     Mackie Pai, PA-C    Review of Systems  Constitutional: Positive for fatigue.  HENT: Negative for congestion, sinus pressure, sinus pain and sore throat.   Respiratory: Negative for cough, chest tightness, shortness of  breath and wheezing.   Cardiovascular: Negative for chest pain and palpitations.  Gastrointestinal: Negative for abdominal pain.  Endocrine: Negative for polydipsia, polyphagia and polyuria.  Genitourinary: Negative for dysuria and frequency.  Musculoskeletal: Negative for back pain.       Left shoulder pain. See hpi.  Skin: Negative for rash.  Neurological: Positive for dizziness and light-headedness. Negative for tremors, syncope, facial asymmetry, speech difficulty, weakness and headaches.  Hematological: Negative for adenopathy. Does not bruise/bleed easily.  Psychiatric/Behavioral: Negative for behavioral problems and decreased concentration.       Objective:   Physical Exam        Assessment & Plan:

## 2020-04-24 MED FILL — VENLAFAXINE HCL ER 150 MG C: 150 | 30 days supply | Qty: 30 | Fill #3

## 2020-05-29 ENCOUNTER — Other Ambulatory Visit: Payer: Self-pay | Admitting: Family Medicine

## 2020-05-29 DIAGNOSIS — Z638 Other specified problems related to primary support group: Secondary | ICD-10-CM

## 2020-05-29 DIAGNOSIS — F4323 Adjustment disorder with mixed anxiety and depressed mood: Secondary | ICD-10-CM

## 2020-05-29 MED FILL — VENLAFAXINE HCL ER 150 MG C: 150 | 30 days supply | Qty: 30 | Fill #0

## 2020-07-14 MED FILL — VENLAFAXINE HCL ER 150 MG C: 150 | 30 days supply | Qty: 30 | Fill #1

## 2020-07-26 NOTE — Progress Notes (Deleted)
[place] at [facility] [address], [place], [place] [address] [phone]  Date:  07/28/2020   Name:  Brian [name]   DOB:  [birth year]   MRN:  [id number]  PCP:  Darreld Mclean, MD    Chief Complaint: No chief complaint on file.   History of Present Illness:  Brian [name] is a 38 y.o. very pleasant male patient who presents with the following:  Generally healthy young man with history of diverticulitis, here today for physical  Last seen by myself in November to discuss some concerns about mood-at that time we increased his dose of Effexor to 150 mg which seemed to be working well  Needs HIV and hep C screening Recent CBC on chart, otherwise can catch up on labs COVID series  He is married, his wife is a Designer, jewellery They have a 67-year-old son and 63-year-old daughter  Never smoker Alcohol Exercise  Patient Active Problem List   Diagnosis Date Noted   Diverticulitis of colon 10/28/2016   Injury of right hand 04/19/2012    Past Medical History:  Diagnosis Date   Diverticulitis 2017    No past surgical history on file.  Social History   Tobacco Use   Smoking status: Never Smoker   Smokeless tobacco: Never Used  Substance Use Topics   Alcohol use: Yes    Alcohol/week: 8.0 standard drinks    Types: 8 Cans of beer per week   Drug use: No    Family History  Problem Relation Age of Onset   Heart attack Father    Diabetes Neg Hx    Hyperlipidemia Neg Hx    Hypertension Neg Hx    Sudden death Neg Hx     Allergies  Allergen Reactions   Bee Venom Anaphylaxis    Medication list has been reviewed and updated.  Current Outpatient Medications on File Prior to Visit  Medication Sig Dispense Refill   EPINEPHrine (EPIPEN) 0.3 mg/0.3 mL DEVI Inject 0.3 mg into the muscle once.     EPINEPHrine 0.3 mg/0.3 mL IJ SOAJ injection Inject 0.3 mLs (0.3 mg total) into the muscle as needed for  anaphylaxis. 2 each prn   meclizine (ANTIVERT) 12.5 MG tablet 1 tab po every 8 hours as needed prn dizziness 30 tablet 0   venlafaxine XR (EFFEXOR-XR) 150 MG 24 hr capsule Take 1 capsule (150 mg total) by mouth daily with breakfast. 90 capsule 0   No current facility-administered medications on file prior to visit.    Review of Systems:  As per HPI- otherwise negative.   Physical Examination: There were no vitals filed for this visit. There were no vitals filed for this visit. There is no height or weight on file to calculate BMI. Ideal Body Weight:    GEN: no acute distress. HEENT: Atraumatic, Normocephalic.  Ears and Nose: No external deformity. CV: RRR, No M/G/R. No JVD. No thrill. No extra heart sounds. PULM: CTA B, no wheezes, crackles, rhonchi. No retractions. No resp. distress. No accessory muscle use. ABD: S, NT, ND, +BS. No rebound. No HSM. EXTR: No c/c/e PSYCH: Normally interactive. Conversant.    Assessment and Plan: *** This visit occurred during the SARS-CoV-2 public health emergency.  Safety protocols were in place, including screening questions prior to the visit, additional usage of staff PPE, and extensive cleaning of exam room while observing appropriate contact time as indicated for disinfecting solutions.    Signed Janett Billow Yoko Mcgahee,  MD

## 2020-07-27 ENCOUNTER — Encounter: Payer: Self-pay | Admitting: Family Medicine

## 2020-07-28 ENCOUNTER — Ambulatory Visit (INDEPENDENT_AMBULATORY_CARE_PROVIDER_SITE_OTHER): Payer: 59 | Admitting: Family Medicine

## 2020-07-28 DIAGNOSIS — Z0000 Encounter for general adult medical examination without abnormal findings: Secondary | ICD-10-CM

## 2020-07-28 DIAGNOSIS — Z1159 Encounter for screening for other viral diseases: Secondary | ICD-10-CM

## 2020-07-28 DIAGNOSIS — Z114 Encounter for screening for human immunodeficiency virus [HIV]: Secondary | ICD-10-CM

## 2020-07-28 DIAGNOSIS — Z13220 Encounter for screening for lipoid disorders: Secondary | ICD-10-CM

## 2020-07-28 DIAGNOSIS — Z131 Encounter for screening for diabetes mellitus: Secondary | ICD-10-CM

## 2020-08-09 NOTE — Patient Instructions (Addendum)
It was good to see you again today, I will be in touch your labs as soon as possible Please be sure you are eating enough!   If you would like to decrease your effexor at some point let me know.  I gave you some generic sildenafil (viagra) to try if you like  Keep me posted    Health Maintenance, Male Adopting a healthy lifestyle and getting preventive care are important in promoting health and wellness. Ask your health care provider about:  The right schedule for you to have regular tests and exams.  Things you can do on your own to prevent diseases and keep yourself healthy. What should I know about diet, weight, and exercise? Eat a healthy diet   Eat a diet that includes plenty of vegetables, fruits, low-fat dairy products, and lean protein.  Do not eat a lot of foods that are high in solid fats, added sugars, or sodium. Maintain a healthy weight Body mass index (BMI) is a measurement that can be used to identify possible weight problems. It estimates body fat based on height and weight. Your health care provider can help determine your BMI and help you achieve or maintain a healthy weight. Get regular exercise Get regular exercise. This is one of the most important things you can do for your health. Most adults should:  Exercise for at least 150 minutes each week. The exercise should increase your heart rate and make you sweat (moderate-intensity exercise).  Do strengthening exercises at least twice a week. This is in addition to the moderate-intensity exercise.  Spend less time sitting. Even light physical activity can be beneficial. Watch cholesterol and blood lipids Have your blood tested for lipids and cholesterol at 40 years of age, then have this test every 5 years. You may need to have your cholesterol levels checked more often if:  Your lipid or cholesterol levels are high.  You are older than 40 years of age.  You are at high risk for heart disease. What should I  know about cancer screening? Many types of cancers can be detected early and may often be prevented. Depending on your health history and family history, you may need to have cancer screening at various ages. This may include screening for:  Colorectal cancer.  Prostate cancer.  Skin cancer.  Lung cancer. What should I know about heart disease, diabetes, and high blood pressure? Blood pressure and heart disease  High blood pressure causes heart disease and increases the risk of stroke. This is more likely to develop in people who have high blood pressure readings, are of African descent, or are overweight.  Talk with your health care provider about your target blood pressure readings.  Have your blood pressure checked: ? Every 3-5 years if you are 40-11 years of age. ? Every year if you are 40 years old or older.  If you are between the ages of 40 and 13 and are a current or former smoker, ask your health care provider if you should have a one-time screening for abdominal aortic aneurysm (AAA). Diabetes Have regular diabetes screenings. This checks your fasting blood sugar level. Have the screening done:  Once every three years after age 40 if you are at a normal weight and have a low risk for diabetes.  More often and at a younger age if you are overweight or have a high risk for diabetes. What should I know about preventing infection? Hepatitis B If you have a higher risk  for hepatitis B, you should be screened for this virus. Talk with your health care provider to find out if you are at risk for hepatitis B infection. Hepatitis C Blood testing is recommended for:  Everyone born from [birth year] through 1965.  Anyone with known risk factors for hepatitis C. Sexually transmitted infections (STIs)  You should be screened each year for STIs, including gonorrhea and chlamydia, if: ? You are sexually active and are younger than 40 years of age. ? You are older than 40 years of age and  your health care provider tells you that you are at risk for this type of infection. ? Your sexual activity has changed since you were last screened, and you are at increased risk for chlamydia or gonorrhea. Ask your health care provider if you are at risk.  Ask your health care provider about whether you are at high risk for HIV. Your health care provider may recommend a prescription medicine to help prevent HIV infection. If you choose to take medicine to prevent HIV, you should first get tested for HIV. You should then be tested every 3 months for as long as you are taking the medicine. Follow these instructions at home: Lifestyle  Do not use any products that contain nicotine or tobacco, such as cigarettes, e-cigarettes, and chewing tobacco. If you need help quitting, ask your health care provider.  Do not use street drugs.  Do not share needles.  Ask your health care provider for help if you need support or information about quitting drugs. Alcohol use  Do not drink alcohol if your health care provider tells you not to drink.  If you drink alcohol: ? Limit how much you have to 0-2 drinks a day. ? Be aware of how much alcohol is in your drink. In the [place] one drink equals one 12 oz bottle of beer (355 mL), one 5 oz glass of wine (148 mL), or one 1 oz glass of hard liquor (44 mL). General instructions  Schedule regular health, dental, and eye exams.  Stay current with your vaccines.  Tell your health care provider if: ? You often feel depressed. ? You have ever been abused or do not feel safe at home. Summary  Adopting a healthy lifestyle and getting preventive care are important in promoting health and wellness.  Follow your health care provider's instructions about healthy diet, exercising, and getting tested or screened for diseases.  Follow your health care provider's instructions on monitoring your cholesterol and blood pressure. This information is not intended to  replace advice given to you by your health care provider. Make sure you discuss any questions you have with your health care provider. Document Revised: 11/08/2018 Document Reviewed: 11/08/2018 Elsevier Patient Education  2020 [employer].

## 2020-08-09 NOTE — Progress Notes (Addendum)
[facility] [place], [place], [place] [address] [phone]  Date:  08/11/2020   Name:  Brian [name]   DOB:  03-Nov-1981   MRN:  [id number]  PCP:  Darreld Mclean, MD    Chief Complaint: Annual Exam   History of Present Illness:  Brian [name] is a 39 y.o. very pleasant male patient who presents with the following:  Here today for a CPE History of diverticulitis Last seen by myself no November 2020 Married, he has 2 young children- they are 34 and 41 yo Last year he noted that his wife was concerned about possible symptoms of depression, and also alcohol overuse; we had him start on Effexor, titrate up to 150 mg XR daily He notes that he ended up getting dizzy if he forgets to take his medication for more than a couple of days-since he realized this and takes it consistently he is not having any side effects Overall he feels like his mood symptoms are much better.  He is very pleased with the overall effect of his medication However, he has noticed a couple of problems-less sex drive, sometimes early ejaculation and/or difficulty with erections  He is trying to exercise more and watch his diet-admits that he sometimes eats only 1200 to 1500 cal daily He is drinking much less alcohol- close to none now He does tend to sweat more than he did in the past   He would [place] to clean out his ears or him today if needed -he notes his symptoms are seems to be fluid in his ear canals, and he can feel congested  He is going to have shoulder surgery coming up He is using aleve as needed His doc is [facility]  He will do his flu shot later at work He did do his covid series- he thinks in May  Wt Readings from Last 3 Encounters:  08/11/20 197 lb (89.4 kg)  10/22/19 198 lb (89.8 kg)  09/10/19 199 lb (90.3 kg)   Hepatitis C screening HIV screening Flu vaccine Routine labs-recent CBC on chart Covid series  Patient Active  Problem List   Diagnosis Date Noted  . Diverticulitis of colon 10/28/2016  . Injury of right hand 04/19/2012    Past Medical History:  Diagnosis Date  . Diverticulitis 2017    History reviewed. No pertinent surgical history.  Social History   Tobacco Use  . Smoking status: Never Smoker  . Smokeless tobacco: Never Used  Substance Use Topics  . Alcohol use: Yes    Alcohol/week: 8.0 standard drinks    Types: 8 Cans of beer per week  . Drug use: No    Family History  Problem Relation Age of Onset  . Heart attack Father   . Diabetes Neg Hx   . Hyperlipidemia Neg Hx   . Hypertension Neg Hx   . Sudden death Neg Hx     Allergies  Allergen Reactions  . Bee Venom Anaphylaxis    Medication list has been reviewed and updated.  Current Outpatient Medications on File Prior to Visit  Medication Sig Dispense Refill  . EPINEPHrine (EPIPEN) 0.3 mg/0.3 mL DEVI Inject 0.3 mg into the muscle once.    Marland Kitchen EPINEPHrine 0.3 mg/0.3 mL IJ SOAJ injection Inject 0.3 mLs (0.3 mg total) into the muscle as needed for anaphylaxis. 2 each prn  . venlafaxine XR (EFFEXOR-XR) 150 MG 24 hr capsule Take 1 capsule (150 mg  total) by mouth daily with breakfast. 90 capsule 0   No current facility-administered medications on file prior to visit.    Review of Systems:  As per HPI- otherwise negative.  No chest pain or shortness of breath No urinary symptoms Physical Examination: Vitals:   08/11/20 1258  BP: 118/80  Pulse: 79  Resp: 16  SpO2: 98%   Vitals:   08/11/20 1258  Weight: 197 lb (89.4 kg)  Height: 5\' 10"  (1.778 m)   Body mass index is 28.27 kg/m. Ideal Body Weight: Weight in (lb) to have BMI = 25: 173.9  GEN: no acute distress.  Looks well, normal weight for build HEENT: Atraumatic, Normocephalic.   TM wnl bilaterally-no wax in ear canals Ears and Nose: No external deformity. CV: RRR, No M/G/R. No JVD. No thrill. No extra heart sounds. PULM: CTA B, no wheezes, crackles, rhonchi.  No retractions. No resp. distress. No accessory muscle use. ABD: S, NT, ND, +BS. No rebound. No HSM. EXTR: No c/c/e PSYCH: Normally interactive. Conversant.    Assessment and Plan: Physical exam  Screening for diabetes mellitus - Plan: Comprehensive metabolic panel, Hemoglobin A1c  Screening for hyperlipidemia - Plan: Lipid panel  Screening for HIV without presence of risk factors - Plan: HIV Antibody (routine testing w rflx)  Encounter for hepatitis C screening test for low risk patient - Plan: Hepatitis C antibody  Adjustment disorder with mixed anxiety and depressed mood - Plan: venlafaxine XR (EFFEXOR-XR) 150 MG 24 hr capsule  Stress due to family tension - Plan: venlafaxine XR (EFFEXOR-XR) 150 MG 24 hr capsule  Drug-induced erectile dysfunction - Plan: sildenafil (REVATIO) 20 MG tablet    Patient here today for a physical exam Routine labs are pending as above Overall Brian [name] is having very good results with his venlafaxine.  However, he has noticed some bothersome side effects as described above.  We talked about switching to a different medication, however for the time being he would like to stick with his current regimen.  I did give him some generic sildenafil to try as needed for erectile difficulty.  As far as the early ejaculation, I urged him to discuss this with his wife- she may not be bothered, which may be reassuring to him If he does not wish to decrease or change antidepressants at some point he will let me know Encouraged him to continue healthy diet and exercise changes, but did advise him that a person of his size likely needs at least 2000 cal/day; ensure he is getting adequate nutrition to support his activity level  Will plan further follow- up pending labs.  This visit occurred during the SARS-CoV-2 public health emergency.  Safety protocols were in place, including screening questions prior to the visit, additional usage of staff PPE, and extensive cleaning  of exam room while observing appropriate contact time as indicated for disinfecting solutions.    Signed Lamar Blinks, MD  Addendum 9/14, received his labs as below-message to patient  Results for orders placed or performed in visit on 08/11/20  Comprehensive metabolic panel  Result Value Ref Range   Glucose, Bld 82 65 - 99 mg/dL   BUN 23 7 - 25 mg/dL   Creat 1.22 0.60 - 1.35 mg/dL   BUN/Creatinine Ratio NOT APPLICABLE 6 - 22 (calc)   Sodium 138 135 - 146 mmol/L   Potassium 4.6 3.5 - 5.3 mmol/L   Chloride 104 98 - 110 mmol/L   CO2 22 20 - 32 mmol/L   Calcium  9.6 8.6 - 10.3 mg/dL   Total Protein 6.6 6.1 - 8.1 g/dL   Albumin 4.7 3.6 - 5.1 g/dL   Globulin 1.9 1.9 - 3.7 g/dL (calc)   AG Ratio 2.5 1.0 - 2.5 (calc)   Total Bilirubin 0.8 0.2 - 1.2 mg/dL   Alkaline phosphatase (APISO) 77 36 - 130 U/L   AST 28 10 - 40 U/L   ALT 26 9 - 46 U/L  Hemoglobin A1c  Result Value Ref Range   Hgb A1c MFr Bld 5.0 <5.7 % of total Hgb   Mean Plasma Glucose 97 (calc)   eAG (mmol/L) 5.4 (calc)  Lipid panel  Result Value Ref Range   Cholesterol 193 <200 mg/dL   HDL 45 > OR = 40 mg/dL   Triglycerides 98 <150 mg/dL   LDL Cholesterol (Calc) 128 (H) mg/dL (calc)   Total CHOL/HDL Ratio 4.3 <5.0 (calc)   Non-HDL Cholesterol (Calc) 148 (H) <130 mg/dL (calc)

## 2020-08-10 ENCOUNTER — Encounter: Payer: Self-pay | Admitting: Family Medicine

## 2020-08-11 ENCOUNTER — Encounter: Payer: Self-pay | Admitting: Family Medicine

## 2020-08-11 ENCOUNTER — Ambulatory Visit (INDEPENDENT_AMBULATORY_CARE_PROVIDER_SITE_OTHER): Payer: 59 | Admitting: Family Medicine

## 2020-08-11 ENCOUNTER — Other Ambulatory Visit: Payer: Self-pay | Admitting: Family Medicine

## 2020-08-11 ENCOUNTER — Other Ambulatory Visit: Payer: Self-pay

## 2020-08-11 VITALS — BP 118/80 | HR 79 | Resp 16 | Ht 70.0 in | Wt 197.0 lb

## 2020-08-11 DIAGNOSIS — Z114 Encounter for screening for human immunodeficiency virus [HIV]: Secondary | ICD-10-CM | POA: Diagnosis not present

## 2020-08-11 DIAGNOSIS — F4323 Adjustment disorder with mixed anxiety and depressed mood: Secondary | ICD-10-CM

## 2020-08-11 DIAGNOSIS — Z13220 Encounter for screening for lipoid disorders: Secondary | ICD-10-CM

## 2020-08-11 DIAGNOSIS — Z1159 Encounter for screening for other viral diseases: Secondary | ICD-10-CM

## 2020-08-11 DIAGNOSIS — N522 Drug-induced erectile dysfunction: Secondary | ICD-10-CM

## 2020-08-11 DIAGNOSIS — Z131 Encounter for screening for diabetes mellitus: Secondary | ICD-10-CM | POA: Diagnosis not present

## 2020-08-11 DIAGNOSIS — Z0000 Encounter for general adult medical examination without abnormal findings: Secondary | ICD-10-CM | POA: Diagnosis not present

## 2020-08-11 DIAGNOSIS — Z638 Other specified problems related to primary support group: Secondary | ICD-10-CM

## 2020-08-11 MED ORDER — VENLAFAXINE HCL ER 150 MG PO CP24: 150.0000 mg | ORAL_CAPSULE | Freq: Every day | ORAL | 3 refills | Status: DC

## 2020-08-11 MED ORDER — SILDENAFIL CITRATE 20 MG PO TABS: ORAL_TABLET | ORAL | 0 refills | Status: DC

## 2020-08-11 MED FILL — SILDENAFIL CITRATE 20 MG TA: 20 | 20 days supply | Qty: 10 | Fill #0

## 2020-08-11 MED FILL — VENLAFAXINE HCL ER 150 MG C: 150 | 30 days supply | Qty: 30 | Fill #2

## 2020-08-12 ENCOUNTER — Encounter: Payer: Self-pay | Admitting: Family Medicine

## 2020-08-12 LAB — COMPREHENSIVE METABOLIC PANEL
AG Ratio: 2.5 (calc) (ref 1.0–2.5)
ALT: 26 U/L (ref 9–46)
AST: 28 U/L (ref 10–40)
Albumin: 4.7 g/dL (ref 3.6–5.1)
Alkaline phosphatase (APISO): 77 U/L (ref 36–130)
BUN: 23 mg/dL (ref 7–25)
CO2: 22 mmol/L (ref 20–32)
Calcium: 9.6 mg/dL (ref 8.6–10.3)
Chloride: 104 mmol/L (ref 98–110)
Creat: 1.22 mg/dL (ref 0.60–1.35)
Globulin: 1.9 g/dL (calc) (ref 1.9–3.7)
Glucose, Bld: 82 mg/dL (ref 65–99)
Potassium: 4.6 mmol/L (ref 3.5–5.3)
Sodium: 138 mmol/L (ref 135–146)
Total Bilirubin: 0.8 mg/dL (ref 0.2–1.2)
Total Protein: 6.6 g/dL (ref 6.1–8.1)

## 2020-08-12 LAB — LIPID PANEL
Cholesterol: 193 mg/dL (ref <200)
HDL: 45 mg/dL (ref >=40)
LDL Cholesterol (Calc): 128 mg/dL (calc) — ABNORMAL HIGH
Non-HDL Cholesterol (Calc): 148 mg/dL (calc) — ABNORMAL HIGH (ref <130)
Total CHOL/HDL Ratio: 4.3 (calc) (ref <5.0)
Triglycerides: 98 mg/dL (ref <150)

## 2020-08-12 LAB — HEPATITIS C ANTIBODY
Hepatitis C Ab: NONREACTIVE
SIGNAL TO CUT-OFF: 0 (ref <1.00)

## 2020-08-12 LAB — HEMOGLOBIN A1C
Hgb A1c MFr Bld: 5 % of total Hgb (ref <5.7)
Mean Plasma Glucose: 97 (calc)
eAG (mmol/L): 5.4 (calc)

## 2020-08-12 LAB — HIV ANTIBODY (ROUTINE TESTING W REFLEX): HIV 1&2 Ab, 4th Generation: NONREACTIVE

## 2020-09-12 ENCOUNTER — Other Ambulatory Visit (HOSPITAL_BASED_OUTPATIENT_CLINIC_OR_DEPARTMENT_OTHER): Payer: Self-pay | Admitting: Orthopedic Surgery

## 2020-09-12 MED FILL — NAPROXEN 500 MG TABS: 500 | 30 days supply | Qty: 60 | Fill #0

## 2020-09-12 MED FILL — CYCLOBENZAPRINE HCL 10 MG T: 10 | 5 days supply | Qty: 30 | Fill #0

## 2020-09-12 MED FILL — ONDANSETRON HCL 4 MG TABLET: 4 | 2 days supply | Qty: 10 | Fill #0

## 2020-09-29 MED FILL — VENLAFAXINE HCL ER 150 MG C: 150 | 30 days supply | Qty: 30 | Fill #0

## 2020-10-27 ENCOUNTER — Other Ambulatory Visit: Payer: Self-pay | Admitting: Internal Medicine

## 2020-10-27 ENCOUNTER — Other Ambulatory Visit: Payer: Self-pay

## 2020-10-27 ENCOUNTER — Telehealth (INDEPENDENT_AMBULATORY_CARE_PROVIDER_SITE_OTHER): Payer: 59 | Admitting: Internal Medicine

## 2020-10-27 VITALS — Ht 70.0 in | Wt 195.0 lb

## 2020-10-27 DIAGNOSIS — L03116 Cellulitis of left lower limb: Secondary | ICD-10-CM

## 2020-10-27 MED ORDER — DOXYCYCLINE HYCLATE 100 MG PO TABS: 100.0000 mg | ORAL_TABLET | Freq: Two times a day (BID) | ORAL | 0 refills | Status: DC

## 2020-10-27 MED FILL — DOXYCYCLINE HYCLATE 100 MG: 100 | 7 days supply | Qty: 15 | Fill #0

## 2020-10-27 NOTE — Progress Notes (Signed)
   Subjective:    Patient ID: Brian [name], male    DOB: 1981-05-25, 39 y.o.   MRN: [id number]  DOS:  10/27/2020 Type of visit - description: Virtual Visit via Video Note  I connected with the above patient  by a video enabled telemedicine application and verified that I am speaking with the correct person using two identifiers.   THIS ENCOUNTER IS A VIRTUAL VISIT DUE TO COVID-19 - PATIENT WAS NOT SEEN IN THE OFFICE. PATIENT HAS CONSENTED TO VIRTUAL VISIT / TELEMEDICINE VISIT   Location of patient: home  Location of provider: office  Persons participating in the virtual visit: patient, provider   I discussed the limitations of evaluation and management by telemedicine and the availability of in person appointments. The patient expressed understanding and agreed to proceed.  Acute 2 days ago, the patient noted a slightly red spot at the right thigh anteriorly. He initially thought that this could be a spider bite however he does not recall any bites. The area is now larger, about 3 inches in diameter, the center is hard, he denies any fluctuance or discharge.  He also denies fever chills or a tick bite.  Review of Systems See above   Past Medical History:  Diagnosis Date  . Diverticulitis 2017    No past surgical history on file.  Allergies as of 10/27/2020      Reactions   Bee Venom Anaphylaxis      Medication List       Accurate as of October 27, 2020  4:23 PM. If you have any questions, ask your nurse or doctor.        EpiPen 0.3 mg/0.3 mL Devi Generic drug: EPINEPHrine Inject 0.3 mg into the muscle once.   EPINEPHrine 0.3 mg/0.3 mL Soaj injection Commonly known as: EPI-PEN Inject 0.3 mLs (0.3 mg total) into the muscle as needed for anaphylaxis.   sildenafil 20 MG tablet Commonly known as: REVATIO Use 20- 100 mg as needed daily prior to intercourse   venlafaxine XR 150 MG 24 hr capsule Commonly known as: EFFEXOR-XR Take 1 capsule (150 mg total) by  mouth daily with breakfast.          Objective:   Physical Exam Ht 5\' 10"  (1.778 m)   Wt 195 lb (88.5 kg)   BMI 27.98 kg/m  This is a virtual video visit, he is alert oriented x3, healthy-appearing.  Through the camera I was able to see the skin of the right leg anterior thigh: He seems to have erythematous elevation surrounded by redness,   size of ~ 2-1/2 inches.  No target-like lesions, no obvious discharge    Assessment    Healthy 39 y/o male, no DM, HTN, on effexor, presents w/:  Skin infection: As described above, cellulitis with early abscess is likely. No fever or systemic symptoms. Plan: Doxycycline x1 week, warm compress, Tylenol as needed.  Definitely call if he is not getting better in the next few days.  Patient verbalized understanding, prescription sent   I discussed the assessment and treatment plan with the patient. The patient was provided an opportunity to ask questions and all were answered. The patient agreed with the plan and demonstrated an understanding of the instructions.   The patient was advised to call back or seek an in-person evaluation if the symptoms worsen or if the condition fails to improve as anticipated.

## 2020-11-05 MED FILL — VENLAFAXINE HCL ER 150 MG C: 150 | 30 days supply | Qty: 30 | Fill #1

## 2020-12-05 MED FILL — VENLAFAXINE HCL ER 150 MG C: 150 | 30 days supply | Qty: 30 | Fill #2

## 2021-01-05 MED FILL — VENLAFAXINE HCL ER 150 MG C: 150 | 30 days supply | Qty: 30 | Fill #3

## 2021-02-20 MED FILL — VENLAFAXINE HCL ER 150 MG C: 150 | 30 days supply | Qty: 30 | Fill #4

## 2021-03-16 ENCOUNTER — Encounter: Payer: Self-pay | Admitting: Family Medicine

## 2021-03-16 NOTE — Progress Notes (Addendum)
[facility] [address], [place], [place] [address] [phone]  Date:  03/19/2021   Name:  Brian [name]   DOB:  Oct 12, 1981   MRN:  [id number]  PCP:  Darreld Mclean, MD    Chief Complaint: Fatigue   History of Present Illness:  Brian [name] is a 40 y.o. very pleasant male patient who presents with the following:  Generally healthy young man, contacted [place] recently with concern of fatigue  Just wanted to reach out to you and see if I need to get some labs taken. I have been extremely tired for the past five or six weeks and feel like I can sleep all day and night. It's kind of a struggle at times to even stay awake. Quite often I take a nap and feel like I can sleep for the entire day. Even after a good nap I still have no issues going to sleep and sleeping through the night.   Today he notes that he feels tired/ sleepy, like he might drift off when sitting quietly. In fact, if he sits down to watch television or to cuddle with one of his kids he will often fall asleep without meaning to.  However, notes that he is sleeping well at night also  He has noted increased fatigue for about 6 weeks He still feels tired when he wakes up in the am He will take a nap when he is able- when he has a day off- because he is so tired He does snore- his wife has noted this. This has not gotten worse that they have noticed He goes to bed by about 9pm and gets up around 6am  Last seen by myself in September for a physical exam Labs good at that time  He was having difficulty with depression and alcohol overuse in the past, now taking effexor  He notes his drinking is not currently a problem, does not generally drink more than 1 beverage per day  No sign of recent illness- no fevers  He has not had his tonsils removed Patient Active Problem List   Diagnosis Date Noted  . Diverticulitis of colon 10/28/2016  . Injury of right hand  04/19/2012    Past Medical History:  Diagnosis Date  . Diverticulitis 2017    No past surgical history on file.  Social History   Tobacco Use  . Smoking status: Never Smoker  . Smokeless tobacco: Never Used  Substance Use Topics  . Alcohol use: Yes    Alcohol/week: 8.0 standard drinks    Types: 8 Cans of beer per week  . Drug use: No    Family History  Problem Relation Age of Onset  . Heart attack Father   . Diabetes Neg Hx   . Hyperlipidemia Neg Hx   . Hypertension Neg Hx   . Sudden death Neg Hx     Allergies  Allergen Reactions  . Bee Venom Anaphylaxis    Medication list has been reviewed and updated.  Current Outpatient Medications on File Prior to Visit  Medication Sig Dispense Refill  . EPINEPHrine (EPI-PEN) 0.3 mg/0.3 mL DEVI Inject 0.3 mg into the muscle once.    Marland Kitchen EPINEPHrine 0.3 mg/0.3 mL IJ SOAJ injection Inject 0.3 mLs (0.3 mg total) into the muscle as needed for anaphylaxis. 2 each prn  . venlafaxine XR (EFFEXOR-XR) 150 MG 24 hr capsule TAKE 1 CAPSULE BY MOUTH DAILY WITH  BREAKFAST 90 capsule 3   No current facility-administered medications on file prior to visit.    Review of Systems:  As per HPI- otherwise negative.   Physical Examination: Vitals:   03/19/21 0932  BP: 118/78  Pulse: 84  Resp: 17  Temp: 97.7 F (36.5 C)  SpO2: 94%   Vitals:   03/19/21 0932  Weight: 203 lb (92.1 kg)  Height: 5\' 10"  (1.778 m)   Body mass index is 29.13 kg/m. Ideal Body Weight: Weight in (lb) to have BMI = 25: 173.9  GEN: no acute distress.  Looks well, minimal overweight HEENT: Atraumatic, Normocephalic. Bilateral TM wnl, oropharynx normal.  PEERL,EOMI.   Somewhat small posterior oropharynx, tonsils are normal Ears and Nose: No external deformity. CV: RRR, No M/G/R. No JVD. No thrill. No extra heart sounds. PULM: CTA B, no wheezes, crackles, rhonchi. No retractions. No resp. distress. No accessory muscle use. ABD: S, NT, ND, +BS. No rebound. No  HSM. EXTR: No c/c/e PSYCH: Normally interactive. Conversant.    Assessment and Plan: Fatigue, unspecified type - Plan: CBC, TSH, VITAMIN D 25 Hydroxy (Vit-D Deficiency, Fractures), Ferritin  Somnolence  Screening for diabetes mellitus - Plan: Comprehensive metabolic panel, Hemoglobin A1c  Patient today with concern of fatigue and somnolence.  I suspect he may suffer from sleep apnea.  Will obtain lab work as above to look for any other potential culprit.  If this is all normal we plan to proceed with sleep study.  Patient understands the plan and is in agreement I encouraged him to mask when necessary, do not drive if feeling very sleepy This visit occurred during the SARS-CoV-2 public health emergency.  Safety protocols were in place, including screening questions prior to the visit, additional usage of staff PPE, and extensive cleaning of exam room while observing appropriate contact time as indicated for disinfecting solutions.    Signed Lamar Blinks, MD  Received his labs and sent message to pt  Results for orders placed or performed in visit on 03/19/21  CBC  Result Value Ref Range   WBC 6.0 4.0 - 10.5 K/uL   RBC 5.43 4.22 - 5.81 Mil/uL   Platelets 226.0 150.0 - 400.0 K/uL   Hemoglobin 15.9 13.0 - 17.0 g/dL   HCT 46.3 39.0 - 52.0 %   MCV 85.2 78.0 - 100.0 fl   MCHC 34.4 30.0 - 36.0 g/dL   RDW 12.9 11.5 - 15.5 %  Comprehensive metabolic panel  Result Value Ref Range   Sodium 138 135 - 145 mEq/L   Potassium 4.5 3.5 - 5.1 mEq/L   Chloride 104 96 - 112 mEq/L   CO2 27 19 - 32 mEq/L   Glucose, Bld 92 70 - 99 mg/dL   BUN 20 6 - 23 mg/dL   Creatinine, Ser 1.03 0.40 - 1.50 mg/dL   Total Bilirubin 1.0 0.2 - 1.2 mg/dL   Alkaline Phosphatase 79 39 - 117 U/L   AST 17 0 - 37 U/L   ALT 20 0 - 53 U/L   Total Protein 6.7 6.0 - 8.3 g/dL   Albumin 4.5 3.5 - 5.2 g/dL   GFR 90.39 >60.00 mL/min   Calcium 9.4 8.4 - 10.5 mg/dL  Hemoglobin A1c  Result Value Ref Range   Hgb A1c MFr  Bld 5.4 4.6 - 6.5 %  TSH  Result Value Ref Range   TSH 0.61 0.35 - 4.50 uIU/mL  VITAMIN D 25 Hydroxy (Vit-D Deficiency, Fractures)  Result Value Ref Range   VITD  37.38 30.00 - 100.00 ng/mL  Ferritin  Result Value Ref Range   Ferritin 129.4 22.0 - 322.0 ng/mL

## 2021-03-16 NOTE — Patient Instructions (Addendum)
Great to see you again today! I will be in touch with your labs If all is normal, then our next step would be to eval for sleep apnea with a sleep study Take care, in the meantime nap when you need it and continue to exercise to boost energy

## 2021-03-19 ENCOUNTER — Other Ambulatory Visit: Payer: Self-pay

## 2021-03-19 ENCOUNTER — Ambulatory Visit: Payer: 59 | Admitting: Family Medicine

## 2021-03-19 ENCOUNTER — Encounter: Payer: Self-pay | Admitting: Family Medicine

## 2021-03-19 VITALS — BP 118/78 | HR 84 | Temp 97.7°F | Resp 17 | Ht 70.0 in | Wt 203.0 lb

## 2021-03-19 DIAGNOSIS — Z131 Encounter for screening for diabetes mellitus: Secondary | ICD-10-CM

## 2021-03-19 DIAGNOSIS — R400 Somnolence: Secondary | ICD-10-CM | POA: Diagnosis not present

## 2021-03-19 DIAGNOSIS — R5383 Other fatigue: Secondary | ICD-10-CM | POA: Diagnosis not present

## 2021-03-19 LAB — FERRITIN: Ferritin: 129.4 ng/mL (ref 22.0–322.0)

## 2021-03-19 LAB — COMPREHENSIVE METABOLIC PANEL
ALT: 20 U/L (ref 0–53)
AST: 17 U/L (ref 0–37)
Albumin: 4.5 g/dL (ref 3.5–5.2)
Alkaline Phosphatase: 79 U/L (ref 39–117)
BUN: 20 mg/dL (ref 6–23)
CO2: 27 mEq/L (ref 19–32)
Calcium: 9.4 mg/dL (ref 8.4–10.5)
Chloride: 104 mEq/L (ref 96–112)
Creatinine, Ser: 1.03 mg/dL (ref 0.40–1.50)
GFR: 90.39 mL/min (ref >60.00)
Glucose, Bld: 92 mg/dL (ref 70–99)
Potassium: 4.5 mEq/L (ref 3.5–5.1)
Sodium: 138 mEq/L (ref 135–145)
Total Bilirubin: 1 mg/dL (ref 0.2–1.2)
Total Protein: 6.7 g/dL (ref 6.0–8.3)

## 2021-03-19 LAB — TSH: TSH: 0.61 u[IU]/mL (ref 0.35–4.50)

## 2021-03-19 LAB — CBC
HCT: 46.3 % (ref 39.0–52.0)
Hemoglobin: 15.9 g/dL (ref 13.0–17.0)
MCHC: 34.4 g/dL (ref 30.0–36.0)
MCV: 85.2 fl (ref 78.0–100.0)
Platelets: 226 10*3/uL (ref 150.0–400.0)
RBC: 5.43 Mil/uL (ref 4.22–5.81)
RDW: 12.9 % (ref 11.5–15.5)
WBC: 6 10*3/uL (ref 4.0–10.5)

## 2021-03-19 LAB — VITAMIN D 25 HYDROXY (VIT D DEFICIENCY, FRACTURES): VITD: 37.38 ng/mL (ref 30.00–100.00)

## 2021-03-19 LAB — HEMOGLOBIN A1C: Hgb A1c MFr Bld: 5.4 % (ref 4.6–6.5)

## 2021-03-19 NOTE — Addendum Note (Signed)
Addended by: Lamar Blinks C on: 03/19/2021 07:56 PM   Modules accepted: Orders

## 2021-03-26 ENCOUNTER — Other Ambulatory Visit (HOSPITAL_BASED_OUTPATIENT_CLINIC_OR_DEPARTMENT_OTHER): Payer: Self-pay

## 2021-03-26 MED FILL — Venlafaxine HCl Cap ER 24HR 150 MG (Base Equivalent): ORAL | 30 days supply | Qty: 30 | Fill #0 | Status: AC

## 2021-04-21 ENCOUNTER — Other Ambulatory Visit (HOSPITAL_BASED_OUTPATIENT_CLINIC_OR_DEPARTMENT_OTHER): Payer: Self-pay

## 2021-04-21 MED FILL — Venlafaxine HCl Cap ER 24HR 150 MG (Base Equivalent): ORAL | 30 days supply | Qty: 30 | Fill #1 | Status: AC

## 2021-04-22 ENCOUNTER — Other Ambulatory Visit (HOSPITAL_BASED_OUTPATIENT_CLINIC_OR_DEPARTMENT_OTHER): Payer: Self-pay

## 2021-05-12 ENCOUNTER — Institutional Professional Consult (permissible substitution): Payer: 59 | Admitting: Neurology

## 2021-05-20 ENCOUNTER — Other Ambulatory Visit (HOSPITAL_BASED_OUTPATIENT_CLINIC_OR_DEPARTMENT_OTHER): Payer: Self-pay

## 2021-05-20 MED FILL — Venlafaxine HCl Cap ER 24HR 150 MG (Base Equivalent): ORAL | 30 days supply | Qty: 30 | Fill #2 | Status: AC

## 2021-07-05 ENCOUNTER — Telehealth: Payer: 59 | Admitting: Physician Assistant

## 2021-07-05 DIAGNOSIS — F4323 Adjustment disorder with mixed anxiety and depressed mood: Secondary | ICD-10-CM | POA: Diagnosis not present

## 2021-07-05 MED ORDER — VENLAFAXINE HCL ER 150 MG PO CP24: ORAL_CAPSULE | Freq: Every day | ORAL | 0 refills | Status: DC

## 2021-07-05 MED FILL — Venlafaxine HCl Cap ER 24HR 150 MG (Base Equivalent): ORAL | 30 days supply | Qty: 30 | Fill #3 | Status: CN

## 2021-07-05 NOTE — Progress Notes (Signed)
Virtual Visit Consent   Brian [name], you are scheduled for a virtual visit with a [facility] provider today.     Just as with appointments in the office, your consent must be obtained to participate.  Your consent will be active for this visit and any virtual visit you may have with one of our providers in the next 365 days.     If you have a MyChart account, a copy of this consent can be sent to you electronically.  All virtual visits are billed to your insurance company just like a traditional visit in the office.    As this is a virtual visit, video technology does not allow for your provider to perform a traditional examination.  This may limit your provider's ability to fully assess your condition.  If your provider identifies any concerns that need to be evaluated in person or the need to arrange testing (such as labs, EKG, etc.), we will make arrangements to do so.     Although advances in technology are sophisticated, we cannot ensure that it will always work on either your end or our end.  If the connection with a video visit is poor, the visit may have to be switched to a telephone visit.  With either a video or telephone visit, we are not always able to ensure that we have a secure connection.     I need to obtain your verbal consent now.   Are you willing to proceed with your visit today?    Brian [name] has provided verbal consent on 07/05/2021 for a virtual visit (video or telephone).   Leeanne Rio, [place]   Date: 07/05/2021 3:16 PM   Virtual Visit via Video Note   I, Leeanne Rio, connected with  Brian [name]  ([id number], 03-10-1981) on 07/05/21 at  3:00 PM EDT by a video-enabled telemedicine application and verified that I am speaking with the correct person using two identifiers.  Location: Patient: Virtual Visit Location Patient: Home Provider: Virtual Visit Location Provider: Home Office   I discussed the limitations of evaluation and management by  telemedicine and the availability of in person appointments. The patient expressed understanding and agreed to proceed.    History of Present Illness: Brian [name] is a 40 y.o. who identifies as a male who was assigned male at birth, and is being seen today for medication needs. Patient with history of anxiety/adjustment disorder, on longstanding Effexor XR 150 mg daily. Put in for regular refill near the end of last week but was not ready for pickup by close of business Friday. His regular pharmacy is not open on weekends so his last dose of SNRI was on Thursday morning. He is noting headache, increased anxiety and tingling in hands. No AMS, chest pain, tachycardia. Was wanting to see if he could get a few tablets of Effexor to last him until he can pick up his full fill.   HPI: HPI  Problems:  Patient Active Problem List   Diagnosis Date Noted   Diverticulitis of colon 10/28/2016   Injury of right hand 04/19/2012    Allergies:  Allergies  Allergen Reactions   Bee Venom Anaphylaxis   Medications:  Current Outpatient Medications:    EPINEPHrine (EPI-PEN) 0.3 mg/0.3 mL DEVI, Inject 0.3 mg into the muscle once., Disp: , Rfl:    EPINEPHrine 0.3 mg/0.3 mL IJ SOAJ injection, Inject 0.3 mLs (0.3 mg total) into the muscle as needed for anaphylaxis., Disp: 2  each, Rfl: prn   venlafaxine XR (EFFEXOR-XR) 150 MG 24 hr capsule, TAKE 1 CAPSULE BY MOUTH DAILY WITH BREAKFAST, Disp: 7 capsule, Rfl: 0  Observations/Objective: Patient is well-developed, well-nourished in no acute distress.  Resting comfortably at home.  Head is normocephalic, atraumatic.  No labored breathing. Speech is clear and coherent with logical content.  Patient is alert and oriented at baseline.   Assessment and Plan: 1. Adjustment disorder with mixed anxiety and depressed mood - venlafaxine XR (EFFEXOR-XR) 150 MG 24 hr capsule; TAKE 1 CAPSULE BY MOUTH DAILY WITH BREAKFAST  Dispense: 7 capsule; Refill: 0  Out of  medication as of Friday morning. Was unable to get script from filling pharmacy [facility]) before close Friday. They are not open on weekends. He is noting headache and anxiety concerning form mild SNRI withdrawal effects. Will send in 1 week of medication to restart ASAP to mitigate any further SNRI withdrawal until getting his regular 64-monthscript Monday when pharmacy opens. Message sent to patient's PCP to make her aware.   Follow Up Instructions: I discussed the assessment and treatment plan with the patient. The patient was provided an opportunity to ask questions and all were answered. The patient agreed with the plan and demonstrated an understanding of the instructions.  A copy of instructions were sent to the patient via MyChart.  The patient was advised to call back or seek an in-person evaluation if the symptoms worsen or if the condition fails to improve as anticipated.  Time:  I spent 10 minutes with the patient via telehealth technology discussing the above problems/concerns.    WLeeanne Rio PA-C

## 2021-07-06 ENCOUNTER — Other Ambulatory Visit (HOSPITAL_BASED_OUTPATIENT_CLINIC_OR_DEPARTMENT_OTHER): Payer: Self-pay

## 2021-07-07 ENCOUNTER — Other Ambulatory Visit (HOSPITAL_BASED_OUTPATIENT_CLINIC_OR_DEPARTMENT_OTHER): Payer: Self-pay

## 2021-07-07 ENCOUNTER — Other Ambulatory Visit: Payer: Self-pay | Admitting: Family Medicine

## 2021-07-07 DIAGNOSIS — F4323 Adjustment disorder with mixed anxiety and depressed mood: Secondary | ICD-10-CM

## 2021-07-07 DIAGNOSIS — Z638 Other specified problems related to primary support group: Secondary | ICD-10-CM

## 2021-07-07 MED ORDER — VENLAFAXINE HCL ER 150 MG PO CP24
ORAL_CAPSULE | Freq: Every day | ORAL | 3 refills | Status: DC
  Filled 2021-07-07: qty 30, 30d supply, fill #0
  Filled 2021-07-31: qty 30, 30d supply, fill #1
  Filled 2021-08-31: qty 30, 30d supply, fill #2
  Filled 2021-09-25: qty 30, 30d supply, fill #3
  Filled 2021-10-28: qty 30, 30d supply, fill #4
  Filled 2021-12-02: qty 30, 30d supply, fill #5

## 2021-07-31 ENCOUNTER — Other Ambulatory Visit (HOSPITAL_BASED_OUTPATIENT_CLINIC_OR_DEPARTMENT_OTHER): Payer: Self-pay

## 2021-08-08 NOTE — Progress Notes (Deleted)
[place] at [facility] [address] [place], [place] [address] [phone]  Date:  08/12/2021   Name:  Brian [name]   DOB:  Aug 19, 1981   MRN:  [id number]  PCP:  Darreld Mclean, MD    Chief Complaint: No chief complaint on file.   History of Present Illness:  Brian [name] is a 40 y.o. very pleasant male patient who presents with the following:  Patient seen today for physical exam.  History of diverticulitis, depression, alcohol overuse in remission Last visit with myself in April when he was feeling fatigued and sleepy At that time we got lab work which was unrevealing and I placed a referral to neurology for sleep study.  However, I do not see any neurology follow-up as of yet  He did a virtual visit with Einar Pheasant last month just a refill of medication, there is an issue with pharmacy being closed.  I advised patient that in this case he can certainly send me a MyChart message in the future  Flu vaccine COVID booster Metabolic profile, ferritin, vitamin D, CBC, A1c, thyroid done in April-we did not do a lipid profile Patient Active Problem List   Diagnosis Date Noted   Diverticulitis of colon 10/28/2016   Injury of right hand 04/19/2012    Past Medical History:  Diagnosis Date   Diverticulitis 2017    No past surgical history on file.  Social History   Tobacco Use   Smoking status: Never   Smokeless tobacco: Never  Substance Use Topics   Alcohol use: Yes    Alcohol/week: 8.0 standard drinks    Types: 8 Cans of beer per week   Drug use: No    Family History  Problem Relation Age of Onset   Heart attack Father    Diabetes Neg Hx    Hyperlipidemia Neg Hx    Hypertension Neg Hx    Sudden death Neg Hx     Allergies  Allergen Reactions   Bee Venom Anaphylaxis    Medication list has been reviewed and updated.  Current Outpatient Medications on File Prior to Visit  Medication Sig Dispense Refill    EPINEPHrine (EPI-PEN) 0.3 mg/0.3 mL DEVI Inject 0.3 mg into the muscle once.     EPINEPHrine 0.3 mg/0.3 mL IJ SOAJ injection Inject 0.3 mLs (0.3 mg total) into the muscle as needed for anaphylaxis. 2 each prn   venlafaxine XR (EFFEXOR-XR) 150 MG 24 hr capsule TAKE 1 CAPSULE BY MOUTH DAILY WITH BREAKFAST 7 capsule 0   venlafaxine XR (EFFEXOR-XR) 150 MG 24 hr capsule TAKE 1 CAPSULE BY MOUTH DAILY WITH BREAKFAST 90 capsule 3   No current facility-administered medications on file prior to visit.    Review of Systems:  As per HPI- otherwise negative.   Physical Examination: There were no vitals filed for this visit. There were no vitals filed for this visit. There is no height or weight on file to calculate BMI. Ideal Body Weight:    GEN: no acute distress. HEENT: Atraumatic, Normocephalic.  Ears and Nose: No external deformity. CV: RRR, No M/G/R. No JVD. No thrill. No extra heart sounds. PULM: CTA B, no wheezes, crackles, rhonchi. No retractions. No resp. distress. No accessory muscle use. ABD: S, NT, ND, +BS. No rebound. No HSM. EXTR: No c/c/e PSYCH: Normally interactive. Conversant.    Assessment and Plan: ***  This visit occurred during the SARS-CoV-2 public health emergency.  Safety  protocols were in place, including screening questions prior to the visit, additional usage of staff PPE, and extensive cleaning of exam room while observing appropriate contact time as indicated for disinfecting solutions.   Signed Lamar Blinks, MD

## 2021-08-12 ENCOUNTER — Encounter: Payer: 59 | Admitting: Family Medicine

## 2021-08-17 ENCOUNTER — Ambulatory Visit (HOSPITAL_COMMUNITY): Payer: 59 | Admitting: Licensed Clinical Social Worker

## 2021-08-17 ENCOUNTER — Other Ambulatory Visit: Payer: Self-pay

## 2021-08-18 NOTE — Progress Notes (Deleted)
[place] at [facility] [address] [place], [place] [address] [phone]  Date:  08/19/2021   Name:  Brian [name]   DOB:  Jul 26, 1981   MRN:  [id number]  PCP:  Darreld Mclean, MD    Chief Complaint: No chief complaint on file.   History of Present Illness:  Brian [name] is a 40 y.o. very pleasant male patient who presents with the following:  Patient here today for physical exam.  Generally in good health except history of diverticulitis, depression and anxiety Last visit with myself was in April He was seen virtually in August by Elyn Aquas, PA-C just for refill of his maintenance venlafaxine 150  Flu vaccine COVID series Some lab work done in April-CMP, ferritin, vitamin D, CBC Can update lipid profile today  Patient Active Problem List   Diagnosis Date Noted   Diverticulitis of colon 10/28/2016   Injury of right hand 04/19/2012    Past Medical History:  Diagnosis Date   Diverticulitis 2017    No past surgical history on file.  Social History   Tobacco Use   Smoking status: Never   Smokeless tobacco: Never  Substance Use Topics   Alcohol use: Yes    Alcohol/week: 8.0 standard drinks    Types: 8 Cans of beer per week   Drug use: No    Family History  Problem Relation Age of Onset   Heart attack Father    Diabetes Neg Hx    Hyperlipidemia Neg Hx    Hypertension Neg Hx    Sudden death Neg Hx     Allergies  Allergen Reactions   Bee Venom Anaphylaxis    Medication list has been reviewed and updated.  Current Outpatient Medications on File Prior to Visit  Medication Sig Dispense Refill   EPINEPHrine (EPI-PEN) 0.3 mg/0.3 mL DEVI Inject 0.3 mg into the muscle once.     EPINEPHrine 0.3 mg/0.3 mL IJ SOAJ injection Inject 0.3 mLs (0.3 mg total) into the muscle as needed for anaphylaxis. 2 each prn   venlafaxine XR (EFFEXOR-XR) 150 MG 24 hr capsule TAKE 1 CAPSULE BY MOUTH DAILY WITH BREAKFAST  7 capsule 0   venlafaxine XR (EFFEXOR-XR) 150 MG 24 hr capsule TAKE 1 CAPSULE BY MOUTH DAILY WITH BREAKFAST 90 capsule 3   No current facility-administered medications on file prior to visit.    Review of Systems:  As per HPI- otherwise negative.   Physical Examination: There were no vitals filed for this visit. There were no vitals filed for this visit. There is no height or weight on file to calculate BMI. Ideal Body Weight:    GEN: no acute distress. HEENT: Atraumatic, Normocephalic.  Ears and Nose: No external deformity. CV: RRR, No M/G/R. No JVD. No thrill. No extra heart sounds. PULM: CTA B, no wheezes, crackles, rhonchi. No retractions. No resp. distress. No accessory muscle use. ABD: S, NT, ND, +BS. No rebound. No HSM. EXTR: No c/c/e PSYCH: Normally interactive. Conversant.    Assessment and Plan: ***  This visit occurred during the SARS-CoV-2 public health emergency.  Safety protocols were in place, including screening questions prior to the visit, additional usage of staff PPE, and extensive cleaning of exam room while observing appropriate contact time as indicated for disinfecting solutions.   Signed Lamar Blinks, MD

## 2021-08-19 ENCOUNTER — Encounter: Payer: 59 | Admitting: Family Medicine

## 2021-08-31 ENCOUNTER — Other Ambulatory Visit (HOSPITAL_BASED_OUTPATIENT_CLINIC_OR_DEPARTMENT_OTHER): Payer: Self-pay

## 2021-09-25 ENCOUNTER — Other Ambulatory Visit (HOSPITAL_BASED_OUTPATIENT_CLINIC_OR_DEPARTMENT_OTHER): Payer: Self-pay

## 2021-10-28 ENCOUNTER — Other Ambulatory Visit (HOSPITAL_BASED_OUTPATIENT_CLINIC_OR_DEPARTMENT_OTHER): Payer: Self-pay

## 2021-10-29 ENCOUNTER — Other Ambulatory Visit (HOSPITAL_BASED_OUTPATIENT_CLINIC_OR_DEPARTMENT_OTHER): Payer: Self-pay

## 2021-11-19 ENCOUNTER — Other Ambulatory Visit (HOSPITAL_BASED_OUTPATIENT_CLINIC_OR_DEPARTMENT_OTHER): Payer: Self-pay

## 2021-12-02 ENCOUNTER — Other Ambulatory Visit (HOSPITAL_BASED_OUTPATIENT_CLINIC_OR_DEPARTMENT_OTHER): Payer: Self-pay

## 2021-12-20 NOTE — Patient Instructions (Addendum)
Good to see you today, I will be in touch with your lab results asap   Please call neurology and reschedule a visit for sleep eval Address: [address], [place], [place] [address] Phone: [phone]  I will get you set up with GI here at the [facility]- ok to call them if you like!   Continue venlafaxine- if you are not doing ok with your mood please let me know!  Please schedule early am labs at your convenience

## 2021-12-20 NOTE — Progress Notes (Signed)
[place] at [facility] [address] [place], [place] [address] [phone]  Date:  12/24/2021   Name:  Brian [name]   DOB:  Jan 10, 1981   MRN:  [id number]  PCP:  Darreld Mclean, MD    Chief Complaint: Annual Exam (Concerns/ questions: 1. joint pain. 2. Epi pen is almost out of date./Flu shot today: delcline/)   History of Present Illness:  Brian [name] is a 41 y.o. very pleasant male patient who presents with the following:  Pt seen today for a CPE History of anxiety  Last seen by myself 4/22 when he had concern of fatigue, somnolence, snoring  I placed a referral to neurology for possible sleep eval but he was not seen- he needs to reschedule this and he plans to do this   Most recent labs 4/22  Flu vaccine- declines  Covid series-encouraged him to complete  He has a left shoulder surgery about a year ago per Dr. Onnie Graham- cleaned up arthritis ,etc The right shoulder is bothering him a lot now too  He has also started to have some pain in his B elbows-especially exacerbated with things like bicep curls.  He has tried to modify his workouts, his job is not physically active  Tay admits to some symptoms of depression.  There is some tension in his marriage, he is seeing a Social worker.  He is also taking venlafaxine.  He denies any danger of self-harm  Also, he notes a long history of periodic rectal bleeding, thought due to hemorrhoids.  He has not seen GI in several years, would like a referral to reestablish care Patient Active Problem List   Diagnosis Date Noted   Diverticulitis of colon 10/28/2016   Injury of right hand 04/19/2012    Past Medical History:  Diagnosis Date   Diverticulitis 2017    No past surgical history on file.  Social History   Tobacco Use   Smoking status: Never   Smokeless tobacco: Never  Substance Use Topics   Alcohol use: Yes    Alcohol/week: 8.0 standard drinks    Types: 8  Cans of beer per week   Drug use: No    Family History  Problem Relation Age of Onset   Heart attack Father    Diabetes Neg Hx    Hyperlipidemia Neg Hx    Hypertension Neg Hx    Sudden death Neg Hx     Allergies  Allergen Reactions   Bee Venom Anaphylaxis    Medication list has been reviewed and updated.  Current Outpatient Medications on File Prior to Visit  Medication Sig Dispense Refill   EPINEPHrine (EPI-PEN) 0.3 mg/0.3 mL DEVI Inject 0.3 mg into the muscle once.     No current facility-administered medications on file prior to visit.    Review of Systems:  As per HPI- otherwise negative.   Physical Examination: Vitals:   12/24/21 1046  BP: 110/70  Pulse: 86  Resp: 18  Temp: 97.8 F (36.6 C)  SpO2: 97%   Vitals:   12/24/21 1046  Weight: 205 lb 12.8 oz (93.4 kg)  Height: 5\' 10"  (1.778 m)   Body mass index is 29.53 kg/m. Ideal Body Weight: Weight in (lb) to have BMI = 25: 173.9  GEN: no acute distress.  Muscular build, looks well HEENT: Atraumatic, Normocephalic. Bilateral TM wnl, oropharynx normal.  PEERL,EOMI.   Ears and Nose: No external deformity. CV: RRR, No M/G/R.  No JVD. No thrill. No extra heart sounds. PULM: CTA B, no wheezes, crackles, rhonchi. No retractions. No resp. distress. No accessory muscle use. ABD: S, NT, ND, +BS. No rebound. No HSM. EXTR: No c/c/e PSYCH: Normally interactive. Conversant.    Assessment and Plan: Physical exam  Screening for deficiency anemia - Plan: CBC  Screening for diabetes mellitus - Plan: Comprehensive metabolic panel, Hemoglobin A1c  Screening for hyperlipidemia - Plan: Lipid panel  Other fatigue - Plan: Testosterone Total,Free,Bio, Males-([facility]), TSH  Adjustment disorder with mixed anxiety and depressed mood - Plan: venlafaxine XR (EFFEXOR-XR) 150 MG 24 hr capsule  Rectal bleeding - Plan: Ambulatory referral to Gastroenterology  Physical exam today- encouraged healthy diet and exercise routine   Will plan further follow- up pending labs. Refill venlafaxine, he is seeing a Social worker.  Asked him to contact us if we can provide any further assistance.  Also urged him to seek immediate care if any risk of self-harm  Labs are ordered as above-he will come back for labs as he would like to do a testosterone level as well Referral to GI He will reschedule with neurology Signed Lamar Blinks, MD

## 2021-12-24 ENCOUNTER — Ambulatory Visit (INDEPENDENT_AMBULATORY_CARE_PROVIDER_SITE_OTHER): Payer: 59 | Admitting: Family Medicine

## 2021-12-24 VITALS — BP 110/70 | HR 86 | Temp 97.8°F | Resp 18 | Ht 70.0 in | Wt 205.8 lb

## 2021-12-24 DIAGNOSIS — Z13220 Encounter for screening for lipoid disorders: Secondary | ICD-10-CM

## 2021-12-24 DIAGNOSIS — Z131 Encounter for screening for diabetes mellitus: Secondary | ICD-10-CM

## 2021-12-24 DIAGNOSIS — K625 Hemorrhage of anus and rectum: Secondary | ICD-10-CM

## 2021-12-24 DIAGNOSIS — F4323 Adjustment disorder with mixed anxiety and depressed mood: Secondary | ICD-10-CM

## 2021-12-24 DIAGNOSIS — R5383 Other fatigue: Secondary | ICD-10-CM

## 2021-12-24 DIAGNOSIS — Z0000 Encounter for general adult medical examination without abnormal findings: Secondary | ICD-10-CM | POA: Diagnosis not present

## 2021-12-24 DIAGNOSIS — Z130 Encounter for screening for diseases of the blood and blood-forming organs and certain disorders involving the immune mechanism: Secondary | ICD-10-CM | POA: Diagnosis not present

## 2021-12-24 MED ORDER — VENLAFAXINE HCL ER 150 MG PO CP24: ORAL_CAPSULE | Freq: Every day | ORAL | 3 refills | Status: DC

## 2021-12-28 ENCOUNTER — Other Ambulatory Visit: Payer: Self-pay | Admitting: Family Medicine

## 2021-12-28 ENCOUNTER — Other Ambulatory Visit (HOSPITAL_BASED_OUTPATIENT_CLINIC_OR_DEPARTMENT_OTHER): Payer: Self-pay

## 2021-12-28 ENCOUNTER — Other Ambulatory Visit: Payer: 59

## 2021-12-28 DIAGNOSIS — F4323 Adjustment disorder with mixed anxiety and depressed mood: Secondary | ICD-10-CM

## 2021-12-28 DIAGNOSIS — Z638 Other specified problems related to primary support group: Secondary | ICD-10-CM

## 2021-12-30 ENCOUNTER — Encounter: Payer: Self-pay | Admitting: Family Medicine

## 2021-12-30 ENCOUNTER — Other Ambulatory Visit (INDEPENDENT_AMBULATORY_CARE_PROVIDER_SITE_OTHER): Payer: 59

## 2021-12-30 DIAGNOSIS — Z13220 Encounter for screening for lipoid disorders: Secondary | ICD-10-CM

## 2021-12-30 DIAGNOSIS — Z131 Encounter for screening for diabetes mellitus: Secondary | ICD-10-CM

## 2021-12-30 DIAGNOSIS — R5383 Other fatigue: Secondary | ICD-10-CM

## 2021-12-30 DIAGNOSIS — Z130 Encounter for screening for diseases of the blood and blood-forming organs and certain disorders involving the immune mechanism: Secondary | ICD-10-CM

## 2021-12-30 LAB — CBC
HCT: 44.2 % (ref 39.0–52.0)
Hemoglobin: 15 g/dL (ref 13.0–17.0)
MCHC: 34 g/dL (ref 30.0–36.0)
MCV: 86.5 fl (ref 78.0–100.0)
Platelets: 195 10*3/uL (ref 150.0–400.0)
RBC: 5.12 Mil/uL (ref 4.22–5.81)
RDW: 12.9 % (ref 11.5–15.5)
WBC: 5.7 10*3/uL (ref 4.0–10.5)

## 2021-12-30 LAB — TESTOSTERONE TOTAL,FREE,BIO, MALES
Albumin: 4.5 g/dL (ref 3.6–5.1)
Sex Hormone Binding: 19 nmol/L (ref 10–50)
Testosterone: 249 ng/dL — ABNORMAL LOW (ref 250–827)

## 2021-12-30 LAB — COMPREHENSIVE METABOLIC PANEL
ALT: 30 U/L (ref 0–53)
AST: 22 U/L (ref 0–37)
Albumin: 4.6 g/dL (ref 3.5–5.2)
Alkaline Phosphatase: 76 U/L (ref 39–117)
BUN: 24 mg/dL — ABNORMAL HIGH (ref 6–23)
CO2: 28 mEq/L (ref 19–32)
Calcium: 9.3 mg/dL (ref 8.4–10.5)
Chloride: 107 mEq/L (ref 96–112)
Creatinine, Ser: 1.11 mg/dL (ref 0.40–1.50)
GFR: 82.18 mL/min (ref >60.00)
Glucose, Bld: 97 mg/dL (ref 70–99)
Potassium: 4.5 mEq/L (ref 3.5–5.1)
Sodium: 140 mEq/L (ref 135–145)
Total Bilirubin: 0.6 mg/dL (ref 0.2–1.2)
Total Protein: 6.4 g/dL (ref 6.0–8.3)

## 2021-12-30 LAB — LIPID PANEL
Cholesterol: 222 mg/dL — ABNORMAL HIGH (ref 0–200)
HDL: 36.9 mg/dL — ABNORMAL LOW (ref >39.00)
LDL Cholesterol: 161 mg/dL — ABNORMAL HIGH (ref 0–99)
NonHDL: 185
Total CHOL/HDL Ratio: 6
Triglycerides: 118 mg/dL (ref 0.0–149.0)
VLDL: 23.6 mg/dL (ref 0.0–40.0)

## 2021-12-30 LAB — TSH: TSH: 1.09 u[IU]/mL (ref 0.35–5.50)

## 2021-12-30 LAB — HEMOGLOBIN A1C: Hgb A1c MFr Bld: 5.2 % (ref 4.6–6.5)

## 2021-12-31 ENCOUNTER — Encounter: Payer: Self-pay | Admitting: Family Medicine

## 2021-12-31 DIAGNOSIS — E668 Other obesity: Secondary | ICD-10-CM

## 2021-12-31 DIAGNOSIS — E291 Testicular hypofunction: Secondary | ICD-10-CM

## 2021-12-31 DIAGNOSIS — R0683 Snoring: Secondary | ICD-10-CM

## 2022-01-05 ENCOUNTER — Encounter: Payer: Self-pay | Admitting: Family Medicine

## 2022-01-05 ENCOUNTER — Telehealth: Payer: Self-pay

## 2022-01-05 ENCOUNTER — Ambulatory Visit (INDEPENDENT_AMBULATORY_CARE_PROVIDER_SITE_OTHER): Payer: 59 | Admitting: Family Medicine

## 2022-01-05 ENCOUNTER — Ambulatory Visit: Payer: 59 | Admitting: Gastroenterology

## 2022-01-05 ENCOUNTER — Other Ambulatory Visit (HOSPITAL_BASED_OUTPATIENT_CLINIC_OR_DEPARTMENT_OTHER): Payer: Self-pay

## 2022-01-05 VITALS — BP 108/72 | HR 114 | Temp 99.6°F | Ht 70.0 in | Wt 202.0 lb

## 2022-01-05 DIAGNOSIS — R197 Diarrhea, unspecified: Secondary | ICD-10-CM | POA: Diagnosis not present

## 2022-01-05 MED ORDER — AMOXICILLIN-POT CLAVULANATE 875-125 MG PO TABS
1.0000 | ORAL_TABLET | Freq: Two times a day (BID) | ORAL | 0 refills | Status: DC
  Filled 2022-01-05: qty 20, 10d supply, fill #0

## 2022-01-05 MED ORDER — DICYCLOMINE HCL 10 MG PO CAPS
ORAL_CAPSULE | ORAL | 0 refills | Status: DC
  Filled 2022-01-05: qty 60, 15d supply, fill #0

## 2022-01-05 NOTE — Telephone Encounter (Signed)
Nurse Assessment Nurse: Sheppard Plumber, RN, Estill Bamberg Date/Time (Eastern Time): 01/05/2022 8:04:21 AM Confirm and document reason for call. If symptomatic, describe symptoms. ---caller states he has had diarrhea and fever since sat afternoon. bad cramping and aches. highest temp 103.9 last night. taking tylenol and that helps. Does the patient have any new or worsening symptoms? ---Yes Will a triage be completed? ---Yes Related visit to physician within the last 2 weeks? ---Yes Does the PT have any chronic conditions? (i.e. diabetes, asthma, this includes High risk factors for pregnancy, etc.) ---No Is this a behavioral health or substance abuse call? ---No Guidelines Guideline Title Affirmed Question Affirmed Notes Nurse Date/Time (Eastern Time) Diarrhea [1] Drinking very little AND [2] dehydration suspected (e.g., no urine > 12 hours, very dry mouth, very lightheaded) [place], RN, Estill Bamberg 01/05/2022 8:05:09 AM Disp. Time Eilene Ghazi Time) Disposition Final User 01/05/2022 8:08:25 AM Go to ED Now (or PCP triage) Yes [place], RN, Estill Bamberg PLEASE NOTE: All timestamps contained within this report are represented as [place] Standard Time. CONFIDENTIALTY NOTICE: This fax transmission is intended only for the addressee. It contains information that is legally privileged, confidential or otherwise protected from use or disclosure. If you are not the intended recipient, you are strictly prohibited from reviewing, disclosing, copying using or disseminating any of this information or taking any action in reliance on or regarding this information. If you have received this fax in error, please notify us immediately by telephone so that we can arrange for its return to [place]. Phone: [phone], Toll-Free: [phone], Fax: [phone] Page: 2 of 2 Call Id: [id number] [place] Disagree/Comply Comply Caller Understands Yes PreDisposition Did not know what to do Care Advice Given Per Guideline GO TO ED NOW  (OR PCP TRIAGE): * IF NO PCP (PRIMARY CARE PROVIDER) SECOND-LEVEL TRIAGE: You need to be seen within the next hour. Go to the [facility] at _____________ [place] as soon as you can. CARE ADVICE given per Diarrhea (Adult) guideline. Referrals GO TO FACILITY UNDECIDED

## 2022-01-05 NOTE — Patient Instructions (Signed)
Give [place] 2-3 business days to get the results of your labs back.   Try to avoid Imodium unless necessary.  Keep pushing fluids/Gatorade.   OK to take Tylenol 1000 mg (2 extra strength tabs) or 975 mg (3 regular strength tabs) every 6 hours as needed.  Let us know if you need anything.

## 2022-01-05 NOTE — Telephone Encounter (Signed)
Pt scheduled with [facility] today at 1 pm.

## 2022-01-05 NOTE — Progress Notes (Signed)
Chief Complaint  Patient presents with   Diarrhea    Fever  Weakness Not eating     Subjective Brian [name] is a 41 y.o. male who presents with vomiting and diarrhea Symptoms began 4 d ago.  Patient has cramping, diarrhea, and fever Patient denies vomiting, nausea, recent travel, abx use, bleeding, mucus, urinary complaints Treatment to date: pushing Gatorade Sick contacts: none known  Past Medical History:  Diagnosis Date   Diverticulitis 2017    Exam BP 108/72    Pulse (!) 114    Temp 99.6 F (37.6 C) (Oral)    Ht 5\' 10"  (1.778 m)    Wt 202 lb (91.6 kg)    SpO2 98%    BMI 28.98 kg/m  General:  well developed, well hydrated, in no apparent distress Skin:  warm, no pallor or diaphoresis, no rashes Throat/Pharynx:  lips and gingiva without lesion; tongue and uvula midline; non-inflamed pharynx; no exudates or postnasal drainage Lungs:  clear to auscultation, breath sounds equal bilaterally, no respiratory distress, no wheezes Cardio:  reg rhythm, tachycardic Abdomen:  abdomen soft, diffusely ttp; bowel sounds normal; no masses or organomegaly; neg Murphy's, McBurney's, Carnett's, Rovsing's Psych: Appropriate judgement/insight  Assessment and Plan  Diarrhea, unspecified type - Plan: CBC, Basic metabolic panel, GI Profile, Stool, PCR, dicyclomine (BENTYL) 10 MG capsule, amoxicillin-clavulanate (AUGMENTIN) 875-125 MG tablet  Ck labs. Empirically tx w Augmentin given fever. Ck stool culture. Avoid aggravating foods, discussed advancing diet. F/u if symptoms fail to improve, UC/ER if worsening. The patient voiced understanding and agreement to the plan.  [place], DO 01/05/22  2:19 PM

## 2022-01-06 LAB — CBC
HCT: 46.3 % (ref 39.0–52.0)
Hemoglobin: 15.6 g/dL (ref 13.0–17.0)
MCHC: 33.6 g/dL (ref 30.0–36.0)
MCV: 87 fl (ref 78.0–100.0)
Platelets: 194 10*3/uL (ref 150.0–400.0)
RBC: 5.33 Mil/uL (ref 4.22–5.81)
RDW: 12.9 % (ref 11.5–15.5)
WBC: 9.9 10*3/uL (ref 4.0–10.5)

## 2022-01-07 ENCOUNTER — Other Ambulatory Visit: Payer: Self-pay | Admitting: Family Medicine

## 2022-01-07 ENCOUNTER — Other Ambulatory Visit (HOSPITAL_BASED_OUTPATIENT_CLINIC_OR_DEPARTMENT_OTHER): Payer: Self-pay

## 2022-01-07 LAB — BASIC METABOLIC PANEL
BUN: 16 mg/dL (ref 6–23)
CO2: 27 mEq/L (ref 19–32)
Calcium: 9.1 mg/dL (ref 8.4–10.5)
Chloride: 101 mEq/L (ref 96–112)
Creatinine, Ser: 1.37 mg/dL (ref 0.40–1.50)
GFR: 63.83 mL/min (ref >60.00)
Glucose, Bld: 112 mg/dL — ABNORMAL HIGH (ref 70–99)
Potassium: 3.8 mEq/L (ref 3.5–5.1)
Sodium: 139 mEq/L (ref 135–145)

## 2022-01-07 LAB — GI PROFILE, STOOL, PCR

## 2022-01-07 MED ORDER — AZITHROMYCIN 500 MG PO TABS
500.0000 mg | ORAL_TABLET | Freq: Every day | ORAL | 0 refills | Status: AC
  Filled 2022-01-07: qty 3, 3d supply, fill #0

## 2022-01-15 ENCOUNTER — Other Ambulatory Visit (HOSPITAL_BASED_OUTPATIENT_CLINIC_OR_DEPARTMENT_OTHER): Payer: Self-pay

## 2022-01-21 ENCOUNTER — Ambulatory Visit: Payer: 59 | Admitting: Gastroenterology

## 2022-01-21 ENCOUNTER — Encounter: Payer: Self-pay | Admitting: Gastroenterology

## 2022-01-21 ENCOUNTER — Other Ambulatory Visit: Payer: Self-pay

## 2022-01-21 VITALS — BP 106/70 | HR 75 | Wt 207.5 lb

## 2022-01-21 DIAGNOSIS — K921 Melena: Secondary | ICD-10-CM

## 2022-01-21 NOTE — Progress Notes (Signed)
Chief Complaint: Hematochezia   Referring Provider:     Darreld Mclean, MD   HPI:     Brian [name] is a 41 y.o. male referred to the [facility]ology Clinic for evaluation of hematochezia.  Has been present intermittently for 7+ years. Described as BRBPR, and increased frequency lately. Can fill toilet water at times. No associated constipation, diarrhea, abdominal pain, dyschezia.  No prior EGD or colonsocopy.    Did have acute nausea/vomiting/diarrhea earlier this month, diagnosed with Campylobacter on GI PCR panel.  Was treated with Zithromax by [facility] with resolution of symptoms.  Otherwise normal CMP and CBC earlier this month.  No known family history of CRC, GI malignancy, liver disease, pancreatic disease, or IBD.    Past Medical History:  Diagnosis Date   Diverticulitis 2017     History reviewed. No pertinent surgical history. Family History  Problem Relation Age of Onset   Heart attack Father    Diabetes Neg Hx    Hyperlipidemia Neg Hx    Hypertension Neg Hx    Sudden death Neg Hx    Colon cancer Neg Hx    Stomach cancer Neg Hx    Pancreatic cancer Neg Hx    Esophageal cancer Neg Hx    Liver cancer Neg Hx    Social History   Tobacco Use   Smoking status: Never   Smokeless tobacco: Never  Vaping Use   Vaping Use: Never used  Substance Use Topics   Alcohol use: Yes    Alcohol/week: 8.0 standard drinks    Types: 8 Cans of beer per week   Drug use: No   Current Outpatient Medications  Medication Sig Dispense Refill   EPINEPHrine (EPI-PEN) 0.3 mg/0.3 mL DEVI Inject 0.3 mg into the muscle once.     venlafaxine XR (EFFEXOR-XR) 150 MG 24 hr capsule TAKE 1 CAPSULE BY MOUTH DAILY WITH BREAKFAST 90 capsule 3   No current facility-administered medications for this visit.   Allergies  Allergen Reactions   Bee Venom Anaphylaxis     Review of Systems: All systems reviewed and negative except where noted in HPI.     Physical Exam:     Wt Readings from Last 3 Encounters:  01/21/22 207 lb 8 oz (94.1 kg)  01/05/22 202 lb (91.6 kg)  12/24/21 205 lb 12.8 oz (93.4 kg)    BP 106/70    Pulse 75    Wt 207 lb 8 oz (94.1 kg)    SpO2 96%    BMI 29.77 kg/m  Constitutional:  Pleasant, in no acute distress. Psychiatric: Normal mood and affect. Behavior is normal. Cardiovascular: Normal rate, regular rhythm. No edema Pulmonary/chest: Effort normal and breath sounds normal. No wheezing, rales or rhonchi. Abdominal: Soft, nondistended, nontender. Bowel sounds active throughout. There are no masses palpable. No hepatomegaly. Neurological: Alert and oriented to person place and time. Skin: Skin is warm and dry. No rashes noted. Rectal: Exam deferred to time of colonoscopy.    ASSESSMENT AND PLAN;   1) Hematochezia - Discussed full DDx to include benign anorectal pathology (i.e. hemorrhoids) vs polyps, ulcer, AVMs, etc.  Due to age, duration of symptoms, increasing frequency, and increasing patient concerns, plan to proceed with colonoscopy - Schedule colonoscopy - No associated anemia on recent labs - If clinically significant hemorrhoids, discussed hemorrhoid band ligation   The indications, risks, and benefits of colonoscopy were explained to the patient in detail.  Risks include but are not limited to bleeding, perforation, adverse reaction to medications, and cardiopulmonary compromise. Sequelae include but are not limited to the possibility of surgery, hospitalization, and mortality. The patient verbalized understanding and wished to proceed. All questions answered, referred to the scheduler and bowel prep ordered. Further recommendations pending results of the exam.     Lavena Bullion, DO, FACG  01/21/2022, 2:02 PM   Cop[place], Gay Filler, MD

## 2022-01-21 NOTE — Patient Instructions (Signed)
If you are age 42 or older, your body mass index should be between 23-30. Your Body mass index is 29.77 kg/m. If this is out of the aforementioned range listed, please consider follow up with your Primary Care Provider.  If you are age 44 or younger, your body mass index should be between 19-25. Your Body mass index is 29.77 kg/m. If this is out of the aformentioned range listed, please consider follow up with your Primary Care Provider.   ________________________________________________________  The [facility] providers would like to encourage you to use [facility] to communicate with providers for non-urgent requests or questions.  Due to long hold times on the telephone, sending your provider a message by [facility] may be a faster and more efficient way to get a response.  Please allow 48 business hours for a response.  Please remember that this is for non-urgent requests.  _______________________________________________________  Dennis Bast have been scheduled for a colonoscopy. Please follow written instructions given to you at your visit today.  Please pick up your prep supplies at the pharmacy within the next 1-3 days. If you use inhalers (even only as needed), please bring them with you on the day of your procedure.  We have given you samples of the following medication to take: Clenpiq  It was a pleasure to see you today!  Vito Cirigliano, D.O.

## 2022-01-26 NOTE — Addendum Note (Signed)
Addended by: Lamar Blinks C on: 01/26/2022 03:28 PM   Modules accepted: Orders

## 2022-01-28 ENCOUNTER — Other Ambulatory Visit (HOSPITAL_BASED_OUTPATIENT_CLINIC_OR_DEPARTMENT_OTHER): Payer: Self-pay

## 2022-01-28 ENCOUNTER — Other Ambulatory Visit: Payer: Self-pay | Admitting: Family Medicine

## 2022-01-28 DIAGNOSIS — Z638 Other specified problems related to primary support group: Secondary | ICD-10-CM

## 2022-01-28 DIAGNOSIS — F4323 Adjustment disorder with mixed anxiety and depressed mood: Secondary | ICD-10-CM

## 2022-02-02 ENCOUNTER — Encounter: Payer: 59 | Admitting: Gastroenterology

## 2022-02-04 ENCOUNTER — Encounter: Payer: Self-pay | Admitting: Gastroenterology

## 2022-02-12 ENCOUNTER — Encounter: Payer: Self-pay | Admitting: Family Medicine

## 2022-02-12 NOTE — Progress Notes (Addendum)
Therapist, music at [facility] ?[address], [address] ?[place], [place] [address] ?336 [phone] ?Fax [phone] ? ?Date:  02/17/2022  ? ?Name:  Brian [name]   DOB:  09-05-1981   MRN:  [id number] ? ?PCP:  Darreld Mclean, MD  ? ? ?Chief Complaint: Labs Only ? ? ?History of Present Illness: ? ?Brian [name] is a 41 y.o. very pleasant male patient who presents with the following: ? ?Patient seen today for concern of hypergonadism ?We did lab work in February, noted to have low testosterone at that time-I also placed a referral for sleep evaluation for concern of sleep apnea ?Need to get repeat T levels today ? ?We discussed risks and benefits of testosterone therapy.  Advised patient that sperm counts may be lowered-this is not a concern, he is already status post vasectomy. ?His most recent lipid panel had gone up, we will need to follow this while he is on testosterone ? ?He has no history of cardiovascular disease and is generally in good health ? ?He would like to use gel if we can -however his wife is a PA so she can give IM injections if needed ? ?No family history of prostate cancer  ?Patient Active Problem List  ? Diagnosis Date Noted  ? Diverticulitis of colon 10/28/2016  ? Injury of right hand 04/19/2012  ? ? ?Past Medical History:  ?Diagnosis Date  ? Diverticulitis 2017  ? ? ?No past surgical history on file. ? ?Social History  ? ?Tobacco Use  ? Smoking status: Never  ? Smokeless tobacco: Never  ?Vaping Use  ? Vaping Use: Never used  ?Substance Use Topics  ? Alcohol use: Yes  ?  Alcohol/week: 8.0 standard drinks  ?  Types: 8 Cans of beer per week  ? Drug use: No  ? ? ?Family History  ?Problem Relation Age of Onset  ? Heart attack Father   ? Diabetes Neg Hx   ? Hyperlipidemia Neg Hx   ? Hypertension Neg Hx   ? Sudden death Neg Hx   ? Colon cancer Neg Hx   ? Stomach cancer Neg Hx   ? Pancreatic cancer Neg Hx   ? Esophageal cancer Neg Hx   ? Liver cancer Neg Hx   ? ? ?Allergies   ?Allergen Reactions  ? Bee Venom Anaphylaxis  ? ? ?Medication list has been reviewed and updated. ? ?Current Outpatient Medications on File Prior to Visit  ?Medication Sig Dispense Refill  ? EPINEPHrine (EPI-PEN) 0.3 mg/0.3 mL DEVI Inject 0.3 mg into the muscle once.    ? venlafaxine XR (EFFEXOR-XR) 150 MG 24 hr capsule TAKE 1 CAPSULE BY MOUTH DAILY WITH BREAKFAST 90 capsule 3  ? ?No current facility-administered medications on file prior to visit.  ? ? ?Review of Systems: ? ?As per HPI- otherwise negative. ? ? ?Physical Examination: ?Vitals:  ? 02/17/22 0850  ?BP: 125/85  ?Pulse: 90  ?SpO2: 97%  ? ?There were no vitals filed for this visit. ?There is no height or weight on file to calculate BMI. ?Ideal Body Weight:   ? ?GEN: no acute distress.  Normal weight, looks well ?HEENT: Atraumatic, Normocephalic.  ?Ears and Nose: No external deformity. ?CV: RRR, No M/G/R. No JVD. No thrill. No extra heart sounds. ?PULM: CTA B, no wheezes, crackles, rhonchi. No retractions. No resp. distress. No accessory muscle use. ?EXTR: No c/c/e ?PSYCH: Normally interactive. Conversant.  ? ? ?Assessment and Plan: ?Hypogonadism male - Plan: PSA,  Testosterone Total,Free,Bio, Males-([facility]) ? ?Patient seen today for follow-up of hypogonadism.  He was noted to have a low testosterone level in February.  If confirmed today we can start him on replacement therapy.  We will check PSA as well ? ?If we do start testosterone therapy we will plan to recheck his levels and also lipid profile in about 3 months ? ?Signed ?Lamar Blinks, MD ? ?Received labs 3/23- message to pt ? ?Results for orders placed or performed in visit on 02/17/22  ?PSA  ?Result Value Ref Range  ? PSA 0.22 0.10 - 4.00 ng/mL  ?Testosterone Total,Free,Bio, Males-([facility])  ?Result Value Ref Range  ? Testosterone 209 (L) 250 - 827 ng/dL  ? Albumin 4.6 3.6 - 5.1 g/dL  ? Sex Hormone Binding 20 10 - 50 nmol/L  ? Testosterone, Free See below 46.0 - 224.0 pg/mL  ? Testosterone,  Bioavailable  110.0 - 575.0 ng/dL  ? ? ? ?

## 2022-02-17 ENCOUNTER — Other Ambulatory Visit (HOSPITAL_BASED_OUTPATIENT_CLINIC_OR_DEPARTMENT_OTHER): Payer: Self-pay

## 2022-02-17 ENCOUNTER — Ambulatory Visit: Payer: 59 | Admitting: Family Medicine

## 2022-02-17 VITALS — BP 125/85 | HR 90

## 2022-02-17 DIAGNOSIS — Z5181 Encounter for therapeutic drug level monitoring: Secondary | ICD-10-CM | POA: Diagnosis not present

## 2022-02-17 DIAGNOSIS — E291 Testicular hypofunction: Secondary | ICD-10-CM | POA: Diagnosis not present

## 2022-02-17 LAB — PSA: PSA: 0.22 ng/mL (ref 0.10–4.00)

## 2022-02-17 MED ORDER — VENLAFAXINE HCL ER 150 MG PO CP24
150.0000 mg | ORAL_CAPSULE | Freq: Every day | ORAL | 0 refills | Status: DC
  Filled 2022-02-17: qty 15, 15d supply, fill #0

## 2022-02-17 NOTE — Patient Instructions (Signed)
Good to see you today- I will be in touch with your labs asap and we can make a plan for you!  ?

## 2022-02-18 ENCOUNTER — Other Ambulatory Visit: Payer: Self-pay

## 2022-02-18 ENCOUNTER — Encounter: Payer: Self-pay | Admitting: Gastroenterology

## 2022-02-18 ENCOUNTER — Other Ambulatory Visit (HOSPITAL_BASED_OUTPATIENT_CLINIC_OR_DEPARTMENT_OTHER): Payer: Self-pay

## 2022-02-18 ENCOUNTER — Encounter: Payer: Self-pay | Admitting: Family Medicine

## 2022-02-18 ENCOUNTER — Telehealth: Payer: Self-pay

## 2022-02-18 ENCOUNTER — Ambulatory Visit (AMBULATORY_SURGERY_CENTER): Payer: 59 | Admitting: Gastroenterology

## 2022-02-18 VITALS — BP 109/65 | HR 66 | Temp 98.6°F | Resp 11 | Ht 70.0 in | Wt 207.0 lb

## 2022-02-18 DIAGNOSIS — K921 Melena: Secondary | ICD-10-CM | POA: Diagnosis not present

## 2022-02-18 DIAGNOSIS — D124 Benign neoplasm of descending colon: Secondary | ICD-10-CM

## 2022-02-18 DIAGNOSIS — E291 Testicular hypofunction: Secondary | ICD-10-CM

## 2022-02-18 DIAGNOSIS — K635 Polyp of colon: Secondary | ICD-10-CM

## 2022-02-18 DIAGNOSIS — K640 First degree hemorrhoids: Secondary | ICD-10-CM | POA: Diagnosis not present

## 2022-02-18 LAB — TESTOSTERONE TOTAL,FREE,BIO, MALES
Albumin: 4.6 g/dL (ref 3.6–5.1)
Sex Hormone Binding: 20 nmol/L (ref 10–50)
Testosterone: 209 ng/dL — ABNORMAL LOW (ref 250–827)

## 2022-02-18 MED ORDER — TESTOSTERONE 20.25 MG/ACT (1.62%) TD GEL
TRANSDERMAL | 2 refills | Status: DC
  Filled 2022-02-18: qty 75, 30d supply, fill #0

## 2022-02-18 MED ORDER — SODIUM CHLORIDE 0.9 % IV SOLN: 500.0000 mL | Freq: Once | INTRAVENOUS | Status: DC

## 2022-02-18 NOTE — Telephone Encounter (Signed)
PA initiated via Covermymeds; KEY: [id number]. Awaiting determination.  ?

## 2022-02-18 NOTE — Progress Notes (Signed)
Pt non-responsive, VVS, Report to RN  °

## 2022-02-18 NOTE — Progress Notes (Signed)
VS completed by [place]. ? ?Medical history reviewed and updated. ? ?

## 2022-02-18 NOTE — Progress Notes (Signed)
? ?GASTROENTEROLOGY PROCEDURE H&P NOTE  ? ?Primary Care Physician: ?Copland, Gay Filler, MD ? ? ? ?Reason for Procedure:   Hematochezia ? ?Plan:    Colonoscopy ? ?Patient is appropriate for endoscopic procedure(s) in the ambulatory ([facility]) setting. ? ?The nature of the procedure, as well as the risks, benefits, and alternatives were carefully and thoroughly reviewed with the patient. Ample time for discussion and questions allowed. The patient understood, was satisfied, and agreed to proceed.  ? ? ? ?HPI: ?Brian [name] is a 41 y.o. male who presents for Colonoscopy for evaluation of Hematochezia.  Patient was most recently seen in the Gastroenterology Clinic on 01/21/2022 by me.  No interval change in medical history since that appointment. Please refer to that note for full details regarding GI history and clinical presentation.  ? ?Past Medical History:  ?Diagnosis Date  ? Diverticulitis 2017  ? ? ?History reviewed. No pertinent surgical history. ? ?Prior to Admission medications   ?Medication Sig Start Date End Date Taking? Authorizing Provider  ?venlafaxine XR (EFFEXOR XR) 150 MG 24 hr capsule Take 1 capsule (150 mg total) by mouth daily with breakfast. 02/17/22  Yes Copland, Gay Filler, MD  ?venlafaxine XR (EFFEXOR-XR) 150 MG 24 hr capsule TAKE 1 CAPSULE BY MOUTH DAILY WITH BREAKFAST 12/24/21 12/24/22 Yes Copland, Gay Filler, MD  ?EPINEPHrine (EPI-PEN) 0.3 mg/0.3 mL DEVI Inject 0.3 mg into the muscle once.    [provider]  ?Testosterone (ANDROGEL PUMP) 20.25 MG/ACT (1.62%) GEL Apply 20.25g (one pump gel) to each shoulder daily 02/18/22   Copland, Gay Filler, MD  ? ? ?Current Outpatient Medications  ?Medication Sig Dispense Refill  ? venlafaxine XR (EFFEXOR XR) 150 MG 24 hr capsule Take 1 capsule (150 mg total) by mouth daily with breakfast. 15 capsule 0  ? venlafaxine XR (EFFEXOR-XR) 150 MG 24 hr capsule TAKE 1 CAPSULE BY MOUTH DAILY WITH BREAKFAST 90 capsule 3  ? EPINEPHrine (EPI-PEN) 0.3 mg/0.3 mL DEVI  Inject 0.3 mg into the muscle once.    ? Testosterone (ANDROGEL PUMP) 20.25 MG/ACT (1.62%) GEL Apply 20.25g (one pump gel) to each shoulder daily 75 g 2  ? ?Current Facility-Administered Medications  ?Medication Dose Route Frequency Provider Last Rate Last Admin  ? 0.9 %  sodium chloride infusion  500 mL Intravenous Once Kedric Bumgarner V, DO      ? ? ?Allergies as of 02/18/2022 - Review Complete 02/18/2022  ?Allergen Reaction Noted  ? Bee venom Anaphylaxis 04/16/2012  ? ? ?Family History  ?Problem Relation Age of Onset  ? Heart attack Father   ? Diabetes Neg Hx   ? Hyperlipidemia Neg Hx   ? Hypertension Neg Hx   ? Sudden death Neg Hx   ? Colon cancer Neg Hx   ? Stomach cancer Neg Hx   ? Pancreatic cancer Neg Hx   ? Esophageal cancer Neg Hx   ? Liver cancer Neg Hx   ? ? ?Social History  ? ?Socioeconomic History  ? Marital status: Married  ?  Spouse name: Not on file  ? Number of children: Not on file  ? Years of education: Not on file  ? Highest education level: Not on file  ?Occupational History  ? Not on file  ?Tobacco Use  ? Smoking status: Never  ? Smokeless tobacco: Never  ?Vaping Use  ? Vaping Use: Never used  ?Substance and Sexual Activity  ? Alcohol use: Yes  ?  Alcohol/week: 8.0 standard drinks  ?  Types: 8 Cans  of beer per week  ? Drug use: No  ? Sexual activity: Not on file  ?Other Topics Concern  ? Not on file  ?Social History Narrative  ? Not on file  ? ?Social Determinants of Health  ? ?Financial Resource Strain: Not on file  ?Food Insecurity: Not on file  ?Transportation Needs: Not on file  ?Physical Activity: Not on file  ?Stress: Not on file  ?Social Connections: Not on file  ?Intimate Partner Violence: Not on file  ? ? ?Physical Exam: ?Vital signs in last 24 hours: ?'@BP'$  122/76   Pulse 91   Temp 98.6 ?F (37 ?C) (Temporal)   Ht '5\' 10"'$  (1.778 m)   Wt 207 lb (93.9 kg)   SpO2 97%   BMI 29.70 kg/m?  ?GEN: NAD ?EYE: Sclerae anicteric ?ENT: MMM ?CV: Non-tachycardic ?Pulm: CTA b/l ?GI: Soft,  NT/ND ?NEURO:  Alert & Oriented x 3 ? ? ?Gerrit Heck, DO ?[facility] ? ? ?02/18/2022 2:48 PM ? ?

## 2022-02-18 NOTE — Patient Instructions (Signed)
YOU HAD AN ENDOSCOPIC PROCEDURE TODAY AT [facility]:   Refer to the procedure report that was given to you for any specific questions about what was found during the examination.  If the procedure report does not answer your questions, please call your gastroenterologist to clarify.  If you requested that your care partner not be given the details of your procedure findings, then the procedure report has been included in a sealed envelope for you to review at your convenience later. ? ?**Handout given on hemorrhoids and banding** ? ?YOU SHOULD EXPECT: Some feelings of bloating in the abdomen. Passage of more gas than usual.  Walking can help get rid of the air that was put into your GI tract during the procedure and reduce the bloating. If you had a lower endoscopy (such as a colonoscopy or flexible sigmoidoscopy) you may notice spotting of blood in your stool or on the toilet paper. If you underwent a bowel prep for your procedure, you may not have a normal bowel movement for a few days. ? ?Please Note:  You might notice some irritation and congestion in your nose or some drainage.  This is from the oxygen used during your procedure.  There is no need for concern and it should clear up in a day or so. ? ?SYMPTOMS TO REPORT IMMEDIATELY: ? ?Following lower endoscopy (colonoscopy or flexible sigmoidoscopy): ? Excessive amounts of blood in the stool ? Significant tenderness or worsening of abdominal pains ? Swelling of the abdomen that is new, acute ? Fever of 100?F or higher ? ?For urgent or emergent issues, a gastroenterologist can be reached at any hour by calling [phone]. ?Do not use MyChart messaging for urgent concerns.  ? ? ?DIET:  We do recommend a small meal at first, but then you may proceed to your regular diet.  Drink plenty of fluids but you should avoid alcoholic beverages for 24 hours. ? ?ACTIVITY:  You should plan to take it easy for the rest of today and you should NOT DRIVE or  use heavy machinery until tomorrow (because of the sedation medicines used during the test).   ? ?FOLLOW UP: ?Our staff will call the number listed on your records 48-72 hours following your procedure to check on you and address any questions or concerns that you may have regarding the information given to you following your procedure. If we do not reach you, we will leave a message.  We will attempt to reach you two times.  During this call, we will ask if you have developed any symptoms of COVID 19. If you develop any symptoms (ie: fever, flu-like symptoms, shortness of breath, cough etc.) before then, please call [phone].  If you test positive for Covid 19 in the 2 weeks post procedure, please call and report this information to [place].   ? ?If any biopsies were taken you will be contacted by phone or by letter within the next 1-3 weeks.  Please call us at [phone] if you have not heard about the biopsies in 3 weeks.  ? ? ?SIGNATURES/CONFIDENTIALITY: ?You and/or your care partner have signed paperwork which will be entered into your electronic medical record.  These signatures attest to the fact that that the information above on your After Visit Summary has been reviewed and is understood.  Full responsibility of the confidentiality of this discharge information lies with you and/or your care-partner.  ?

## 2022-02-18 NOTE — Op Note (Signed)
[facility] ?Patient Name: Brian [name] ?Procedure Date: 02/18/2022 2:54 PM ?MRN: [id number] ?Endoscopist: Gerrit Heck , MD ?Age: 41 ?Referring MD:  ?Date of Birth: 06-10-1981 ?Gender: Male ?Account #: [id number] ?Procedure:                Colonoscopy ?Indications:              Hematochezia ?                          This is the patient's first colonoscopy. ?Medicines:                Monitored Anesthesia Care ?Procedure:                Pre-Anesthesia Assessment: ?                          - Prior to the procedure, a History and Physical  ?                          was performed, and patient medications and  ?                          allergies were reviewed. The patient's tolerance of  ?                          previous anesthesia was also reviewed. The risks  ?                          and benefits of the procedure and the sedation  ?                          options and risks were discussed with the patient.  ?                          All questions were answered, and informed consent  ?                          was obtained. Prior Anticoagulants: The patient has  ?                          taken no previous anticoagulant or antiplatelet  ?                          agents. ASA Grade Assessment: I - A normal, healthy  ?                          patient. After reviewing the risks and benefits,  ?                          the patient was deemed in satisfactory condition to  ?                          undergo the procedure. ?  After obtaining informed consent, the colonoscope  ?                          was passed under direct vision. Throughout the  ?                          procedure, the patient's blood pressure, pulse, and  ?                          oxygen saturations were monitored continuously. The  ?                          Olympus CF-[id number] (#[id number]) Colonoscope was  ?                          introduced through the anus and advanced to the the  ?                           terminal ileum. The colonoscopy was performed  ?                          without difficulty. The patient tolerated the  ?                          procedure well. The quality of the bowel  ?                          preparation was good. The terminal ileum, ileocecal  ?                          valve, appendiceal orifice, and rectum were  ?                          photographed. ?Scope In: 2:55:31 PM ?Scope Out: 3:10:29 PM ?Scope Withdrawal Time: 0 hours 13 minutes 12 seconds  ?Total Procedure Duration: 0 hours 14 minutes 58 seconds  ?Findings:                 The perianal and digital rectal examinations were  ?                          normal. ?                          A 5 mm polyp was found in the descending colon. The  ?                          polyp was sessile. The polyp was removed with a  ?                          cold snare. Resection and retrieval were complete.  ?                          Estimated blood loss was minimal. ?  The exam was otherwise normal throughout the  ?                          remainder of the colon. ?                          Non-bleeding internal hemorrhoids were found during  ?                          retroflexion. The hemorrhoids were small. ?                          The terminal ileum appeared normal. ?Complications:            No immediate complications. ?Estimated Blood Loss:     Estimated blood loss was minimal. ?Impression:               - One 5 mm polyp in the descending colon, removed  ?                          with a cold snare. Resected and retrieved. ?                          - Non-bleeding internal hemorrhoids. ?                          - The examined portion of the ileum was normal. ?Recommendation:           - Patient has a contact number available for  ?                          emergencies. The signs and symptoms of potential  ?                          delayed complications were discussed with the  ?                           patient. Return to normal activities tomorrow.  ?                          Written discharge instructions were provided to the  ?                          patient. ?                          - Resume previous diet. ?                          - Continue present medications. ?                          - Await pathology results. ?                          - Repeat colonoscopy for surveillance based on  ?  pathology results. ?                          - Return to GI office PRN. ?                          - Use fiber, for example Citrucel, Fibercon, Konsyl  ?                          or Metamucil. ?                          - Internal hemorrhoids were noted on this study and  ?                          may be amenable to hemorrhoid band ligation. If you  ?                          are interested in further treatment of these  ?                          hemorrhoids with band ligation, please contact my  ?                          clinic to set up an appointment for evaluation and  ?                          treatment. ?Gerrit Heck, MD ?02/18/2022 3:15:04 PM ?

## 2022-02-18 NOTE — Progress Notes (Signed)
Called to room to assist during endoscopic procedure.  Patient ID and intended procedure confirmed with present staff. Received instructions for my participation in the procedure from the performing physician.  

## 2022-02-18 NOTE — Addendum Note (Signed)
Addended by: Darreld Mclean on: 02/18/2022 08:45 AM ? ? Modules accepted: Orders ? ?

## 2022-02-19 ENCOUNTER — Other Ambulatory Visit (HOSPITAL_BASED_OUTPATIENT_CLINIC_OR_DEPARTMENT_OTHER): Payer: Self-pay

## 2022-02-22 ENCOUNTER — Telehealth: Payer: Self-pay | Admitting: *Deleted

## 2022-02-22 ENCOUNTER — Other Ambulatory Visit (HOSPITAL_BASED_OUTPATIENT_CLINIC_OR_DEPARTMENT_OTHER): Payer: Self-pay

## 2022-02-22 ENCOUNTER — Telehealth: Payer: Self-pay

## 2022-02-22 NOTE — Telephone Encounter (Signed)
PA denied. ?Request Reference Number: [id number]. TESTOSTERONE GEL 1.62% is denied for not meeting the prior authorization requirement(s). Details of this decision are in the notice attached below or have been faxed to you. ? ?This medicine is ?covered only if: ?You have one of the following: ?(I) Significant reduction in weight (less than 90% ideal body weight) (for example, acquired ?immunodeficiency syndrome wasting syndrome). ?(II) Osteopenia. ?(III) Osteoporosis. ?(IV) Decreased bone density. ?(V) Decreased libido. ?(VI) Organic cause of testosterone deficiency (for example, injury, tumor, infection, or genetic ?defects). ?

## 2022-02-22 NOTE — Telephone Encounter (Signed)
First follow up call attempt.  LVM. 

## 2022-02-22 NOTE — Telephone Encounter (Addendum)
Will attempt appeal. Appeal letter faxed to [facility] at [phone]. Awaiting determination.  ?

## 2022-02-22 NOTE — Telephone Encounter (Signed)
Second post procedure follow up call, no answer 

## 2022-02-23 ENCOUNTER — Encounter: Payer: Self-pay | Admitting: Gastroenterology

## 2022-02-23 ENCOUNTER — Other Ambulatory Visit (HOSPITAL_BASED_OUTPATIENT_CLINIC_OR_DEPARTMENT_OTHER): Payer: Self-pay

## 2022-02-24 ENCOUNTER — Other Ambulatory Visit (HOSPITAL_BASED_OUTPATIENT_CLINIC_OR_DEPARTMENT_OTHER): Payer: Self-pay

## 2022-02-25 ENCOUNTER — Other Ambulatory Visit (HOSPITAL_BASED_OUTPATIENT_CLINIC_OR_DEPARTMENT_OTHER): Payer: Self-pay

## 2022-02-25 MED ORDER — TESTOSTERONE CYPIONATE 100 MG/ML IM SOLN
100.0000 mg | INTRAMUSCULAR | 0 refills | Status: DC
  Filled 2022-02-25: qty 10, 28d supply, fill #0

## 2022-02-25 NOTE — Addendum Note (Signed)
Addended by: Lamar Blinks C on: 02/25/2022 10:35 AM ? ? Modules accepted: Orders ? ?

## 2022-02-26 NOTE — Telephone Encounter (Signed)
Fyi.

## 2022-03-03 NOTE — Telephone Encounter (Signed)
Have not received response. Appeal re-faxed.  ?

## 2022-03-10 NOTE — Telephone Encounter (Signed)
Letter was received stating that the appeal was approved. Covered until 07/26/2022.  ?

## 2022-04-01 ENCOUNTER — Encounter: Payer: Self-pay | Admitting: Family Medicine

## 2022-06-03 NOTE — Progress Notes (Addendum)
[facility] [place], [place], [place] [address] [phone]  Date:  06/10/2022   Name:  Brian [name]   DOB:  03/17/1981   MRN:  [id number]  PCP:  Darreld Mclean, MD    Chief Complaint: Testosterone f/u (Concerns/ questions: none/)   History of Present Illness:  Brian [name] is a 41 y.o. very pleasant male patient who presents with the following:  Here today for follow-up of hypogonadism Patient had 2 low testosterone readings in spring of this year, started replacement therapy with 100 mg testosterone every 14 days He did accidentally take his shot every week for the first several weeks, then he realized his error and backed down to every 14 days  His last shot was nearly 2 weeks ago, also his bottle was a little low so he only got approximately 75 mg he thinks   He does not feel that much different yet- he does notice some improvement the first day or so after each shot but the effect seems to wear off quickly    Patient Active Problem List   Diagnosis Date Noted   Diverticulitis of colon 10/28/2016   Injury of right hand 04/19/2012    Past Medical History:  Diagnosis Date   Diverticulitis 2017    No past surgical history on file.  Social History   Tobacco Use   Smoking status: Never   Smokeless tobacco: Never  Vaping Use   Vaping Use: Never used  Substance Use Topics   Alcohol use: Yes    Alcohol/week: 8.0 standard drinks of alcohol    Types: 8 Cans of beer per week   Drug use: No    Family History  Problem Relation Age of Onset   Heart attack Father    Diabetes Neg Hx    Hyperlipidemia Neg Hx    Hypertension Neg Hx    Sudden death Neg Hx    Colon cancer Neg Hx    Stomach cancer Neg Hx    Pancreatic cancer Neg Hx    Esophageal cancer Neg Hx    Liver cancer Neg Hx     Allergies  Allergen Reactions   Bee Venom Anaphylaxis    Medication list has been reviewed and  updated.  Current Outpatient Medications on File Prior to Visit  Medication Sig Dispense Refill   EPINEPHrine (EPI-PEN) 0.3 mg/0.3 mL DEVI Inject 0.3 mg into the muscle once.     testosterone cypionate (DEPOTESTOTERONE CYPIONATE) 100 MG/ML injection Inject 1 mL (100 mg total) into the muscle every 14 (fourteen) days. 10 mL 0   venlafaxine XR (EFFEXOR-XR) 150 MG 24 hr capsule TAKE 1 CAPSULE BY MOUTH DAILY WITH BREAKFAST 90 capsule 3   No current facility-administered medications on file prior to visit.    Review of Systems:  As per HPI- otherwise negative.   Physical Examination: Vitals:   06/10/22 1105  BP: 120/84  Pulse: 76  Resp: 18  Temp: 97.9 F (36.6 C)  SpO2: 97%   Vitals:   06/10/22 1105  Weight: 209 lb 12.8 oz (95.2 kg)  Height: '5\' 10"'$  (1.778 m)   Body mass index is 30.1 kg/m. Ideal Body Weight: Weight in (lb) to have BMI = 25: 173.9  GEN: no acute distress.  Looks well, mild overweight HEENT: Atraumatic, Normocephalic.  Ears and Nose: No external deformity. CV: RRR, No M/G/R. No JVD. No thrill. No extra heart sounds. PULM: CTA  B, no wheezes, crackles, rhonchi. No retractions. No resp. distress. No accessory muscle use. EXTR: No c/c/e PSYCH: Normally interactive. Conversant.    Assessment and Plan: Hypogonadism male - Plan: Testosterone,Free and Total, CBC  Follow-up for hypogonadism today.  Current dose 100 mg every 14 days.  Patient has unfortunately not noted a lot of improvement in his symptoms as of yet.  Suspect we will need to increase his dosage, await lab.  He is due for refill now  Signed Lamar Blinks, MD  Addendum 7/16, received labs as below.  Message to patient  Results for orders placed or performed in visit on 06/10/22  Testosterone,Free and Total  Result Value Ref Range   Testosterone 183 (L) 264 - 916 ng/dL   Testosterone, Free WILL FOLLOW   CBC  Result Value Ref Range   WBC 7.9 4.0 - 10.5 K/uL   RBC 5.55 4.22 - 5.81 Mil/uL    Platelets 284.0 150.0 - 400.0 K/uL   Hemoglobin 16.1 13.0 - 17.0 g/dL   HCT 47.9 39.0 - 52.0 %   MCV 86.4 78.0 - 100.0 fl   MCHC 33.6 30.0 - 36.0 g/dL   RDW 13.0 11.5 - 15.5 %

## 2022-06-03 NOTE — Patient Instructions (Addendum)
It was good to see you again today, I will be in touch with your labs and we can make any necessary adjustment to your T replacement  Take care!

## 2022-06-07 ENCOUNTER — Encounter: Payer: Self-pay | Admitting: Family Medicine

## 2022-06-10 ENCOUNTER — Ambulatory Visit: Payer: 59 | Admitting: Family Medicine

## 2022-06-10 VITALS — BP 120/84 | HR 76 | Temp 97.9°F | Resp 18 | Ht 70.0 in | Wt 209.8 lb

## 2022-06-10 DIAGNOSIS — E291 Testicular hypofunction: Secondary | ICD-10-CM | POA: Diagnosis not present

## 2022-06-10 LAB — CBC
HCT: 47.9 % (ref 39.0–52.0)
Hemoglobin: 16.1 g/dL (ref 13.0–17.0)
MCHC: 33.6 g/dL (ref 30.0–36.0)
MCV: 86.4 fl (ref 78.0–100.0)
Platelets: 284 10*3/uL (ref 150.0–400.0)
RBC: 5.55 Mil/uL (ref 4.22–5.81)
RDW: 13 % (ref 11.5–15.5)
WBC: 7.9 10*3/uL (ref 4.0–10.5)

## 2022-06-13 ENCOUNTER — Encounter: Payer: Self-pay | Admitting: Family Medicine

## 2022-06-13 DIAGNOSIS — E291 Testicular hypofunction: Secondary | ICD-10-CM

## 2022-06-14 ENCOUNTER — Other Ambulatory Visit (HOSPITAL_BASED_OUTPATIENT_CLINIC_OR_DEPARTMENT_OTHER): Payer: Self-pay

## 2022-06-14 ENCOUNTER — Other Ambulatory Visit: Payer: Self-pay | Admitting: Family Medicine

## 2022-06-14 DIAGNOSIS — E291 Testicular hypofunction: Secondary | ICD-10-CM

## 2022-06-14 MED ORDER — TESTOSTERONE CYPIONATE 100 MG/ML IM SOLN
100.0000 mg | INTRAMUSCULAR | 1 refills | Status: DC
  Filled 2022-06-14: qty 10, 28d supply, fill #0
  Filled 2022-07-20: qty 10, 28d supply, fill #1

## 2022-06-14 MED ORDER — TESTOSTERONE CYPIONATE 100 MG/ML IM SOLN
100.0000 mg | INTRAMUSCULAR | 3 refills | Status: DC
  Filled 2022-06-14: qty 4, 28d supply, fill #0

## 2022-06-14 NOTE — Addendum Note (Signed)
Addended by: Lamar Blinks C on: 06/14/2022 03:31 PM   Modules accepted: Orders

## 2022-06-15 ENCOUNTER — Other Ambulatory Visit (HOSPITAL_BASED_OUTPATIENT_CLINIC_OR_DEPARTMENT_OTHER): Payer: Self-pay

## 2022-06-16 LAB — TESTOSTERONE,FREE AND TOTAL
Testosterone, Free: 5.3 pg/mL — ABNORMAL LOW (ref 6.8–21.5)
Testosterone: 183 ng/dL — ABNORMAL LOW (ref 264–916)

## 2022-07-20 ENCOUNTER — Other Ambulatory Visit (HOSPITAL_BASED_OUTPATIENT_CLINIC_OR_DEPARTMENT_OTHER): Payer: Self-pay

## 2022-07-31 ENCOUNTER — Emergency Department (HOSPITAL_COMMUNITY): Payer: 59

## 2022-07-31 ENCOUNTER — Emergency Department (HOSPITAL_COMMUNITY)
Admission: EM | Admit: 2022-07-31 | Discharge: 2022-07-31 | Disposition: A | Discharge status: Home / Self Care | Payer: 59 | Attending: Emergency Medicine | Admitting: Emergency Medicine

## 2022-07-31 ENCOUNTER — Other Ambulatory Visit: Payer: Self-pay

## 2022-07-31 ENCOUNTER — Encounter (HOSPITAL_COMMUNITY): Payer: Self-pay

## 2022-07-31 DIAGNOSIS — T63481A Toxic effect of venom of other arthropod, accidental (unintentional), initial encounter: Secondary | ICD-10-CM

## 2022-07-31 DIAGNOSIS — T782XXA Anaphylactic shock, unspecified, initial encounter: Secondary | ICD-10-CM | POA: Diagnosis not present

## 2022-07-31 DIAGNOSIS — E876 Hypokalemia: Secondary | ICD-10-CM | POA: Diagnosis not present

## 2022-07-31 DIAGNOSIS — R7989 Other specified abnormal findings of blood chemistry: Secondary | ICD-10-CM | POA: Diagnosis not present

## 2022-07-31 DIAGNOSIS — D72829 Elevated white blood cell count, unspecified: Secondary | ICD-10-CM | POA: Insufficient documentation

## 2022-07-31 DIAGNOSIS — T7840XA Allergy, unspecified, initial encounter: Secondary | ICD-10-CM | POA: Diagnosis present

## 2022-07-31 LAB — CBC WITH DIFFERENTIAL/PLATELET
Abs Immature Granulocytes: 0.11 10*3/uL — ABNORMAL HIGH (ref 0.00–0.07)
Basophils Absolute: 0.1 10*3/uL (ref 0.0–0.1)
Basophils Relative: 1 %
Eosinophils Absolute: 0.1 10*3/uL (ref 0.0–0.5)
Eosinophils Relative: 1 %
HCT: 48 % (ref 39.0–52.0)
Hemoglobin: 16.4 g/dL (ref 13.0–17.0)
Immature Granulocytes: 1 %
Lymphocytes Relative: 37 %
Lymphs Abs: 5.4 10*3/uL — ABNORMAL HIGH (ref 0.7–4.0)
MCH: 30 pg (ref 26.0–34.0)
MCHC: 34.2 g/dL (ref 30.0–36.0)
MCV: 87.8 fL (ref 80.0–100.0)
Monocytes Absolute: 1.5 10*3/uL — ABNORMAL HIGH (ref 0.1–1.0)
Monocytes Relative: 11 %
Neutro Abs: 7.2 10*3/uL (ref 1.7–7.7)
Neutrophils Relative %: 49 %
Platelets: 261 10*3/uL (ref 150–400)
RBC: 5.47 MIL/uL (ref 4.22–5.81)
RDW: 12.8 % (ref 11.5–15.5)
WBC: 14.4 10*3/uL — ABNORMAL HIGH (ref 4.0–10.5)
nRBC: 0 % (ref 0.0–0.2)

## 2022-07-31 LAB — COMPREHENSIVE METABOLIC PANEL
ALT: 27 U/L (ref 0–44)
AST: 29 U/L (ref 15–41)
Albumin: 3.9 g/dL (ref 3.5–5.0)
Alkaline Phosphatase: 65 U/L (ref 38–126)
Anion gap: 11 (ref 5–15)
BUN: 15 mg/dL (ref 6–20)
CO2: 21 mmol/L — ABNORMAL LOW (ref 22–32)
Calcium: 8.6 mg/dL — ABNORMAL LOW (ref 8.9–10.3)
Chloride: 112 mmol/L — ABNORMAL HIGH (ref 98–111)
Creatinine, Ser: 1.44 mg/dL — ABNORMAL HIGH (ref 0.61–1.24)
GFR, Estimated: >60 mL/min (ref >60)
Glucose, Bld: 151 mg/dL — ABNORMAL HIGH (ref 70–99)
Potassium: 3.1 mmol/L — ABNORMAL LOW (ref 3.5–5.1)
Sodium: 144 mmol/L (ref 135–145)
Total Bilirubin: 1 mg/dL (ref 0.3–1.2)
Total Protein: 5.8 g/dL — ABNORMAL LOW (ref 6.5–8.1)

## 2022-07-31 LAB — I-STAT CHEM 8, ED
BUN: 16 mg/dL (ref 6–20)
Calcium, Ion: 1.11 mmol/L — ABNORMAL LOW (ref 1.15–1.40)
Chloride: 109 mmol/L (ref 98–111)
Creatinine, Ser: 1.5 mg/dL — ABNORMAL HIGH (ref 0.61–1.24)
Glucose, Bld: 144 mg/dL — ABNORMAL HIGH (ref 70–99)
HCT: 47 % (ref 39.0–52.0)
Hemoglobin: 16 g/dL (ref 13.0–17.0)
Potassium: 2.9 mmol/L — ABNORMAL LOW (ref 3.5–5.1)
Sodium: 145 mmol/L (ref 135–145)
TCO2: 24 mmol/L (ref 22–32)

## 2022-07-31 LAB — MAGNESIUM: Magnesium: 2 mg/dL (ref 1.7–2.4)

## 2022-07-31 MED ORDER — KETOROLAC TROMETHAMINE 30 MG/ML IJ SOLN
30.0000 mg | Freq: Once | INTRAMUSCULAR | Status: AC
  Administered 2022-07-31: 30 mg via INTRAVENOUS
  Filled 2022-07-31: qty 1

## 2022-07-31 MED ORDER — PREDNISONE 50 MG PO TABS: 50.0000 mg | ORAL_TABLET | Freq: Every day | ORAL | 0 refills | Status: DC

## 2022-07-31 MED ORDER — DIPHENHYDRAMINE HCL 50 MG/ML IJ SOLN
INTRAMUSCULAR | Status: AC
  Filled 2022-07-31: qty 1

## 2022-07-31 MED ORDER — POTASSIUM CHLORIDE 20 MEQ PO PACK
40.0000 meq | PACK | Freq: Once | ORAL | Status: AC
Start: 2022-07-31 — End: 2022-07-31
  Administered 2022-07-31: 40 meq via ORAL
  Filled 2022-07-31: qty 2

## 2022-07-31 MED ORDER — ACETAMINOPHEN 325 MG PO TABS
650.0000 mg | ORAL_TABLET | Freq: Once | ORAL | Status: AC
  Administered 2022-07-31: 650 mg via ORAL
  Filled 2022-07-31: qty 2

## 2022-07-31 MED ORDER — ONDANSETRON HCL 4 MG/2ML IJ SOLN
INTRAMUSCULAR | Status: AC
  Filled 2022-07-31: qty 2

## 2022-07-31 MED ORDER — DEXAMETHASONE SODIUM PHOSPHATE 10 MG/ML IJ SOLN
INTRAMUSCULAR | Status: AC
  Filled 2022-07-31: qty 1

## 2022-07-31 MED ORDER — EPINEPHRINE PF 1 MG/ML IJ SOLN
INTRAMUSCULAR | Status: DC
Start: 2022-07-31 — End: 2022-07-31
  Filled 2022-07-31: qty 1

## 2022-07-31 MED ORDER — DIPHENHYDRAMINE HCL 25 MG PO TABS: 25.0000 mg | ORAL_TABLET | Freq: Four times a day (QID) | ORAL | 0 refills | Status: DC | PRN

## 2022-07-31 MED ORDER — EPINEPHRINE 0.3 MG/0.3ML IJ DEVI: 0.3000 mg | Freq: Once | INTRAMUSCULAR | 0 refills | Status: AC

## 2022-07-31 MED ORDER — RACEPINEPHRINE HCL 2.25 % IN NEBU
INHALATION_SOLUTION | RESPIRATORY_TRACT | Status: AC
  Filled 2022-07-31: qty 0.5

## 2022-07-31 MED ORDER — MORPHINE SULFATE (PF) 4 MG/ML IV SOLN
4.0000 mg | Freq: Once | INTRAVENOUS | Status: DC
  Filled 2022-07-31: qty 1

## 2022-07-31 MED ORDER — EPINEPHRINE HCL 5 MG/250ML IV SOLN IN NS
INTRAVENOUS | Status: AC
  Administered 2022-07-31: 5 ug/min
  Filled 2022-07-31: qty 250

## 2022-07-31 MED ORDER — FAMOTIDINE 20 MG PO TABS: 20.0000 mg | ORAL_TABLET | Freq: Two times a day (BID) | ORAL | 0 refills | Status: DC

## 2022-07-31 NOTE — ED Notes (Signed)
Patient transported to CT by this RN 

## 2022-07-31 NOTE — ED Provider Notes (Addendum)
[facility] Provider Note   CSN: [id number] Arrival date & time: 07/31/22  1255     History  Chief Complaint  Patient presents with   Allergic Reaction    Brian [name] is a 41 y.o. male.   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  He was outside working in the yard when he was stung by bee.  His wife who is a medical provider administered an EpiPen at home.  She immediately drove him to the ED.  During transport to the ED he appeared to have either a seizure or possible syncopal episode.  On arrival the patient was extremely erythematous.  He was diaphoretic.  He was having some coughing spells and dry heaving.  He was looking around the room confused but not able to provide any history.  After treatment approximately 20 minutes later the patient is able to tell me he is complaining of a headache.  He does feel like he is breathing okay.  He has some itching in his throat but does not feel like it is getting worse.  Home Medications Prior to Admission medications   Medication Sig Start Date End Date Taking? Authorizing Provider  diphenhydrAMINE (BENADRYL) 25 MG tablet Take 1 tablet (25 mg total) by mouth every 6 (six) hours as needed. 07/31/22  Yes Dorie Rank, MD  famotidine (PEPCID) 20 MG tablet Take 1 tablet (20 mg total) by mouth 2 (two) times daily. 07/31/22  Yes Dorie Rank, MD  predniSONE (DELTASONE) 50 MG tablet Take 1 tablet (50 mg total) by mouth daily. 07/31/22  Yes Dorie Rank, MD  testosterone cypionate (DEPOTESTOTERONE CYPIONATE) 100 MG/ML injection Inject 1 mL (100 mg total) into the muscle once a week. 06/14/22  Yes Copland, Gay Filler, MD  venlafaxine XR (EFFEXOR-XR) 150 MG 24 hr capsule TAKE 1 CAPSULE BY MOUTH DAILY WITH BREAKFAST Patient taking differently: Take 150 mg by mouth daily with breakfast. 12/24/21 12/24/22 Yes Copland, Gay Filler, MD  EPINEPHrine (EPI-PEN) 0.3  mg/0.3 mL DEVI Inject 0.3 mLs (0.3 mg total) into the muscle once for 1 dose. 07/31/22 07/31/22  Dorie Rank, MD      Allergies    Bee venom    Review of Systems   Review of Systems  Physical Exam Updated Vital Signs BP (!) 121/90   Pulse 77   Resp 14   Ht 1.778 m ('5\' 10"'$ )   Wt 95.2 kg   SpO2 98%   BMI 30.11 kg/m  Physical Exam Vitals and nursing note reviewed.  Constitutional:      General: He is in acute distress.     Appearance: He is well-developed. He is ill-appearing.  HENT:     Head: Normocephalic and atraumatic.     Right Ear: External ear normal.     Left Ear: External ear normal.  Eyes:     General: No scleral icterus.       Right eye: No discharge.        Left eye: No discharge.     Conjunctiva/sclera: Conjunctivae normal.  Neck:     Trachea: No tracheal deviation.  Cardiovascular:     Rate and Rhythm: Normal rate and regular rhythm.  Pulmonary:     Effort: Pulmonary effort is normal. No respiratory distress.     Breath sounds: Normal breath sounds. No stridor. No wheezing or rales.  Abdominal:     General: Bowel sounds are  normal. There is no distension.     Palpations: Abdomen is soft.     Tenderness: There is no abdominal tenderness. There is no guarding or rebound.  Musculoskeletal:        General: No tenderness or deformity.     Cervical back: Neck supple.  Skin:    General: Skin is warm and dry.     Findings: Rash present.     Comments: Diffuse erythematous rash, no urticaria  Neurological:     General: No focal deficit present.     GCS: GCS eye subscore is 4. GCS verbal subscore is 4. GCS motor subscore is 6.     Cranial Nerves: No cranial nerve deficit (no facial droop, extraocular movements intact, no slurred speech).     Sensory: No sensory deficit.     Motor: No abnormal muscle tone.  Psychiatric:        Mood and Affect: Mood normal.     ED Results / Procedures / Treatments   Labs (all labs ordered are listed, but only abnormal results  are displayed) Labs Reviewed  CBC WITH DIFFERENTIAL/PLATELET - Abnormal; Notable for the following components:      Result Value   WBC 14.4 (*)    Lymphs Abs 5.4 (*)    Monocytes Absolute 1.5 (*)    Abs Immature Granulocytes 0.11 (*)    All other components within normal limits  COMPREHENSIVE METABOLIC PANEL - Abnormal; Notable for the following components:   Potassium 3.1 (*)    Chloride 112 (*)    CO2 21 (*)    Glucose, Bld 151 (*)    Creatinine, Ser 1.44 (*)    Calcium 8.6 (*)    Total Protein 5.8 (*)    All other components within normal limits  I-STAT CHEM 8, ED - Abnormal; Notable for the following components:   Potassium 2.9 (*)    Creatinine, Ser 1.50 (*)    Glucose, Bld 144 (*)    Calcium, Ion 1.11 (*)    All other components within normal limits  MAGNESIUM    EKG EKG Interpretation  Date/Time:  Saturday July 31 2022 13:32:02 EDT Ventricular Rate:  91 PR Interval:  139 QRS Duration: 94 QT Interval:  344 QTC Calculation: 424 R Axis:   10 Text Interpretation: Sinus rhythm Abnormal R-wave progression, late transition Abnormal lateral Q waves Confirmed by Dorie Rank [phone]) on 07/31/2022 1:39:47 PM  Radiology CT Head Wo Contrast  Result Date: 07/31/2022 CLINICAL DATA:  Altered mental status, bee sting, allergy, EpiPen administered EXAM: CT HEAD WITHOUT CONTRAST TECHNIQUE: Contiguous axial images were obtained from the base of the skull through the vertex without intravenous contrast. RADIATION DOSE REDUCTION: This exam was performed according to the departmental dose-optimization program which includes automated exposure control, adjustment of the mA and/or kV according to patient size and/or use of iterative reconstruction technique. COMPARISON:  05/14/2012 FINDINGS: Brain: No evidence of acute infarction, hemorrhage, hydrocephalus, extra-axial collection or mass lesion/mass effect. Vascular: No hyperdense vessel or unexpected calcification. Skull: Normal. Negative  for fracture or focal lesion. Sinuses/Orbits: No acute finding. Other: None. IMPRESSION: No acute intracranial pathology. Electronically Signed   By: Delanna Ahmadi M.D.   On: 07/31/2022 14:34    Procedures .Critical Care  Performed by: Dorie Rank, MD Authorized by: Dorie Rank, MD   Critical care provider statement:    Critical care time (minutes):  45   Critical care was time spent personally by me on the following activities:  Development of  treatment plan with patient or surrogate, discussions with consultants, evaluation of patient's response to treatment, examination of patient, ordering and review of laboratory studies, ordering and review of radiographic studies, ordering and performing treatments and interventions, pulse oximetry, re-evaluation of patient's condition and review of old charts     Medications Ordered in ED Medications  EPINEPHrine (ADRENALIN) 1 MG/ML (has no administration in time range)  ondansetron (ZOFRAN) 4 MG/2ML injection (has no administration in time range)  morphine (PF) 4 MG/ML injection 4 mg (has no administration in time range)  Racepinephrine HCl 2.25 % nebulizer solution (has no administration in time range)  EPINEPHrine NaCl 5-0.9 MG/250ML-% premix infusion (0 mcg/min  Stopped 07/31/22 1441)  diphenhydrAMINE (BENADRYL) 50 MG/ML injection (  Given 07/31/22 1315)  dexamethasone (DECADRON) 10 MG/ML injection (  Given 07/31/22 1315)  acetaminophen (TYLENOL) tablet 650 mg (650 mg Oral Given 07/31/22 1405)  ketorolac (TORADOL) 30 MG/ML injection 30 mg (30 mg Intravenous Given 07/31/22 1446)  potassium chloride (KLOR-CON) packet 40 mEq (40 mEq Oral Given 07/31/22 1446)    ED Course/ Medical Decision Making/ A&P Clinical Course as of 07/31/22 1548  Sat Jul 31, 2022  1439 Comprehensive metabolic panel(!) Creatinine elevated compared to previous values [JK]  1439 CBC with Differential(!) White blood cell count elevated [JK]  1440 CT Head Wo Contrast CT without acute  findings [JK]  1511 Patient states he is feeling much better.  He is no longer on an epi drip.  He states he would prefer to go home this evening and does not want to be admitted to the hospital.  We will continue with ED monitoring to make sure he does not have any recurrent symptoms. 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 Decision-making details documented in ED Course. Radiology: ordered. Decision-making details documented in ED Course.  Risk OTC drugs. Prescription drug management.   Patient presented to the ED for evaluation after a bee sting.  Patient has known history of anaphylaxis to bee stings.  Patient reportedly had possible seizure-like activity versus syncopal episode.  Suspect the latter as the patient arrived hypotensive and had diffuse erythema consistent with an acute allergic reaction.  Was concerned about the possibility of airway compromise , patient was started on epinephrine drip after having received epinephrine injection at home.  Patient was also given IV steroids and antihistamines.  Patient initially was confused and had difficult time communicating.  Considered intubation but he started to improve. He started complaining headache.  Did proceed with CT scan of the head and [place] no acute findings were noted.  I suspect this was more likely related to his systemic illness and hypotension.  Patient has improved significantly.  He is no longer on an epi drip and he is now speaking clearly.  He states he is feeling much better.  He has had this type of reaction unfortunately in  response to bee stings.  Suspect this time is all related to his anaphylactic reaction.  Discussed options of being admitted to  the hospital for observation.  Patient states he does not want to be admitted would prefer to go home.  Since him symptoms have completely resolved we will plan on monitoring in the ED for several more hours and reassess at that time..   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

## 2022-07-31 NOTE — ED Triage Notes (Addendum)
Pt was working in the yard and was stung by a bee. Pt is allergic to bees. Pt used epi pen at home. Pt arrived erythematous all over and lethargic. Pt's wife stated pt had a seizure in the vehicle. MD st bedside. Pt was moving all around and dry heaving. Pt stated his throat is burning. Pt is confused. Pt able to maintain airway at this time, but keeps coughing and dry heaving

## 2022-08-24 ENCOUNTER — Ambulatory Visit
Admission: EM | Admit: 2022-08-24 | Discharge: 2022-08-24 | Disposition: A | Discharge status: Home / Self Care | Payer: 59 | Attending: Urgent Care | Admitting: Urgent Care

## 2022-08-24 DIAGNOSIS — J020 Streptococcal pharyngitis: Secondary | ICD-10-CM | POA: Insufficient documentation

## 2022-08-24 DIAGNOSIS — R0982 Postnasal drip: Secondary | ICD-10-CM | POA: Diagnosis not present

## 2022-08-24 DIAGNOSIS — R5383 Other fatigue: Secondary | ICD-10-CM | POA: Insufficient documentation

## 2022-08-24 DIAGNOSIS — R000 Tachycardia, unspecified: Secondary | ICD-10-CM | POA: Insufficient documentation

## 2022-08-24 DIAGNOSIS — R5381 Other malaise: Secondary | ICD-10-CM | POA: Insufficient documentation

## 2022-08-24 DIAGNOSIS — R52 Pain, unspecified: Secondary | ICD-10-CM | POA: Insufficient documentation

## 2022-08-24 DIAGNOSIS — Z20822 Contact with and (suspected) exposure to covid-19: Secondary | ICD-10-CM | POA: Insufficient documentation

## 2022-08-24 LAB — POCT RAPID STREP A (OFFICE): Rapid Strep A Screen: NEGATIVE

## 2022-08-24 MED ORDER — AMOXICILLIN 500 MG PO CAPS: 500.0000 mg | ORAL_CAPSULE | Freq: Two times a day (BID) | ORAL | 0 refills | Status: DC

## 2022-08-24 NOTE — ED Provider Notes (Signed)
[facility]  Note:  This document was prepared using Systems analyst and may include unintentional dictation errors.  MRN: [id number] DOB: 10/25/1981  Subjective:   Brian [name] is a 41 y.o. male presenting for 2-day history of acute onset persistent malaise, fatigue, throat pain, painful swallowing, postnasal drainage, intermittent mild sinus headaches.  Patient had exposure to strep throat through his daughter.  He is not opposed to COVID and flu testing either.  No history of respiratory disorders.  Patient is not a smoker.  No current facility-administered medications for this encounter.  Current Outpatient Medications:    diphenhydrAMINE (BENADRYL) 25 MG tablet, Take 1 tablet (25 mg total) by mouth every 6 (six) hours as needed., Disp: 30 tablet, Rfl: 0   famotidine (PEPCID) 20 MG tablet, Take 1 tablet (20 mg total) by mouth 2 (two) times daily., Disp: 10 tablet, Rfl: 0   predniSONE (DELTASONE) 50 MG tablet, Take 1 tablet (50 mg total) by mouth daily., Disp: 5 tablet, Rfl: 0   testosterone cypionate (DEPOTESTOTERONE CYPIONATE) 100 MG/ML injection, Inject 1 mL (100 mg total) into the muscle once a week., Disp: 10 mL, Rfl: 1   venlafaxine XR (EFFEXOR-XR) 150 MG 24 hr capsule, TAKE 1 CAPSULE BY MOUTH DAILY WITH BREAKFAST (Patient taking differently: Take 150 mg by mouth daily with breakfast.), Disp: 90 capsule, Rfl: 3   Allergies  Allergen Reactions   Bee Venom Anaphylaxis    Past Medical History:  Diagnosis Date   Diverticulitis 2017     History reviewed. No pertinent surgical history.  Family History  Problem Relation Age of Onset   Heart attack Father    Diabetes Neg Hx    Hyperlipidemia Neg Hx    Hypertension Neg Hx    Sudden death Neg Hx    Colon cancer Neg Hx    Stomach cancer Neg Hx    Pancreatic cancer Neg Hx    Esophageal cancer Neg Hx    Liver cancer Neg Hx     Social History   Tobacco Use   Smoking status:  Never   Smokeless tobacco: Never  Vaping Use   Vaping Use: Never used  Substance Use Topics   Alcohol use: Yes    Alcohol/week: 8.0 standard drinks of alcohol    Types: 8 Cans of beer per week   Drug use: No    ROS   Objective:   Vitals: BP 113/73   Pulse (!) 117   Temp 99 F (37.2 C)   Resp 18   SpO2 95%   Physical Exam Constitutional:      General: He is not in acute distress.    Appearance: Normal appearance. He is well-developed and normal weight. He is not ill-appearing, toxic-appearing or diaphoretic.  HENT:     Head: Normocephalic and atraumatic.     Right Ear: Tympanic membrane, ear canal and external ear normal. There is no impacted cerumen.     Left Ear: Tympanic membrane, ear canal and external ear normal. There is no impacted cerumen.     Nose: Nose normal. No congestion or rhinorrhea.     Mouth/Throat:     Mouth: Mucous membranes are moist.     Pharynx: Pharyngeal swelling and posterior oropharyngeal erythema present. No oropharyngeal exudate or uvula swelling.     Tonsils: No tonsillar exudate or tonsillar abscesses. 2+ on the right. 2+ on the left.  Eyes:     General: No scleral icterus.  Right eye: No discharge.        Left eye: No discharge.     Extraocular Movements: Extraocular movements intact.     Conjunctiva/sclera: Conjunctivae normal.  Cardiovascular:     Rate and Rhythm: Normal rate and regular rhythm.     Heart sounds: Normal heart sounds. No murmur heard.    No friction rub. No gallop.  Pulmonary:     Effort: Pulmonary effort is normal. No respiratory distress.     Breath sounds: Normal breath sounds. No stridor. No wheezing, rhonchi or rales.  Musculoskeletal:     Cervical back: Normal range of motion and neck supple. No rigidity. No muscular tenderness.  Neurological:     General: No focal deficit present.     Mental Status: He is alert and oriented to person, place, and time.  Psychiatric:        Mood and Affect: Mood normal.         Behavior: Behavior normal.        Thought Content: Thought content normal.     Results for orders placed or performed during the hospital encounter of 08/24/22 (from the past 24 hour(s))  POCT rapid strep A     Status: None   Collection Time: 08/24/22  6:48 PM  Result Value Ref Range   Rapid Strep A Screen Negative Negative    Assessment and Plan :   PDMP not reviewed this encounter.  1. Streptococcal sore throat   2. Body aches   3. Malaise and fatigue   4. Post-nasal drainage   5. Tachycardia, unspecified     We will treat empirically for strep throat.  However given his overall symptoms and negative rapid strep it is worthwhile to check for COVID and flu.  Recommended supportive care otherwise. Counseled patient on potential for adverse effects with medications prescribed/recommended today, ER and return-to-clinic precautions discussed, patient verbalized understanding.    Jaynee Eagles, [place] 08/24/22 1907

## 2022-08-24 NOTE — ED Triage Notes (Signed)
Pt presents with complaints of sore throat, headache, and feeling generally ill x 2 days. Daughter has strep throat.

## 2022-08-24 NOTE — Discharge Instructions (Addendum)
We will manage this as a strep with amoxicillin. For sore throat or cough try using a honey-based tea. Use 3 teaspoons of honey with juice squeezed from half lemon. Place shaved pieces of ginger into 1/2-1 cup of water and warm over stove top. Then mix the ingredients and repeat every 4 hours as needed. Please take ibuprofen '600mg'$  every 6 hours with food alternating with OR taken together with Tylenol '500mg'$ -'650mg'$  every 6 hours for throat pain, fevers, aches and pains. Hydrate very well with at least 2 liters of water. Eat light meals such as soups (chicken and noodles, vegetable, chicken and wild rice).  Do not eat foods that you are allergic to.  Taking an antihistamine like Zyrtec ('10mg'$ ) can help against postnasal drainage, sinus congestion which can cause sinus pain, sinus headaches, throat pain, painful swallowing, coughing.  You can take this together with pseudoephedrine (Sudafed) at a dose of 30 mg 3 times a day or twice daily as needed for the same kind of nasal drip, congestion.    We will let you know about your COVID and flu test results through mychart. Give [place] a call if you want to have COVID antiviral medication like Paxlovid.

## 2022-08-25 LAB — RESP PANEL BY RT-PCR (FLU A&B, COVID) ARPGX2
Influenza A by PCR: NEGATIVE
Influenza B by PCR: NEGATIVE
SARS Coronavirus 2 by RT PCR: NEGATIVE

## 2022-09-07 ENCOUNTER — Encounter: Payer: Self-pay | Admitting: Family Medicine

## 2022-09-07 DIAGNOSIS — E291 Testicular hypofunction: Secondary | ICD-10-CM

## 2022-09-09 ENCOUNTER — Other Ambulatory Visit (INDEPENDENT_AMBULATORY_CARE_PROVIDER_SITE_OTHER): Payer: 59

## 2022-09-09 ENCOUNTER — Other Ambulatory Visit (HOSPITAL_BASED_OUTPATIENT_CLINIC_OR_DEPARTMENT_OTHER): Payer: Self-pay

## 2022-09-09 DIAGNOSIS — E291 Testicular hypofunction: Secondary | ICD-10-CM

## 2022-09-09 MED ORDER — TESTOSTERONE CYPIONATE 100 MG/ML IM SOLN
100.0000 mg | INTRAMUSCULAR | 1 refills | Status: DC
  Filled 2022-09-09: qty 10, 28d supply, fill #0
  Filled 2022-10-19: qty 10, 28d supply, fill #1

## 2022-09-10 ENCOUNTER — Encounter: Payer: Self-pay | Admitting: Family Medicine

## 2022-09-10 LAB — TESTOSTERONE TOTAL,FREE,BIO, MALES
Albumin: 4.6 g/dL (ref 3.6–5.1)
Sex Hormone Binding: 19 nmol/L (ref 10–50)
Testosterone, Bioavailable: 293.4 ng/dL (ref 110.0–575.0)
Testosterone, Free: 139.7 pg/mL (ref 46.0–224.0)
Testosterone: 651 ng/dL (ref 250–827)

## 2022-09-28 ENCOUNTER — Other Ambulatory Visit (HOSPITAL_BASED_OUTPATIENT_CLINIC_OR_DEPARTMENT_OTHER): Payer: Self-pay

## 2022-09-28 MED ORDER — ONDANSETRON HCL 4 MG PO TABS
4.0000 mg | ORAL_TABLET | Freq: Three times a day (TID) | ORAL | 0 refills | Status: DC | PRN
  Filled 2022-09-28: qty 10, 4d supply, fill #0

## 2022-09-28 MED ORDER — OXYCODONE-ACETAMINOPHEN 5-325 MG PO TABS
1.0000 | ORAL_TABLET | Freq: Four times a day (QID) | ORAL | 0 refills | Status: DC | PRN
  Filled 2022-09-28: qty 15, 4d supply, fill #0

## 2022-09-28 MED ORDER — CYCLOBENZAPRINE HCL 10 MG PO TABS
10.0000 mg | ORAL_TABLET | Freq: Three times a day (TID) | ORAL | 0 refills | Status: DC | PRN
  Filled 2022-09-28: qty 30, 10d supply, fill #0

## 2022-09-28 MED ORDER — NAPROXEN 500 MG PO TABS
500.0000 mg | ORAL_TABLET | Freq: Two times a day (BID) | ORAL | 1 refills | Status: DC
  Filled 2022-09-28: qty 60, 30d supply, fill #0

## 2022-10-06 ENCOUNTER — Other Ambulatory Visit: Payer: 59

## 2022-10-06 ENCOUNTER — Telehealth: Payer: Self-pay

## 2022-10-06 ENCOUNTER — Encounter: Payer: Self-pay | Admitting: Family Medicine

## 2022-10-06 DIAGNOSIS — R062 Wheezing: Secondary | ICD-10-CM

## 2022-10-06 DIAGNOSIS — E876 Hypokalemia: Secondary | ICD-10-CM

## 2022-10-06 NOTE — Telephone Encounter (Signed)
Recent surgery on 10/31- Pt having CP and SHOB. He refuses to go to ED.

## 2022-10-06 NOTE — Telephone Encounter (Signed)
Nurse Assessment Nurse: Kirk Ruths, RN, [facility] Date/Time Eilene Ghazi Time): 10/06/2022 10:45:10 AM Confirm and document reason for call. If symptomatic, describe symptoms. ---Caller states he had surgery on his shoulder on oct 31 he started having cp and shortness of breath for 2 days now he has wheezing and dry cough denies cp or shortness of breath the dry cough is more at night and more in the morning no sore throat and no ear pain no fever Does the patient have any new or worsening symptoms? ---Yes Will a triage be completed? ---Yes Related visit to physician within the last 2 weeks? ---Yes Does the PT have any chronic conditions? (i.e. diabetes, asthma, this includes High risk factors for pregnancy, etc.) ---Yes List chronic conditions. ---depression Is this a behavioral health or substance abuse call? ---No Guidelines Guideline Title Affirmed Question Affirmed Notes Nurse Date/Time (Eastern Time) Breathing Difficulty Wheezing can be heard across the room Kirk Ruths, RN, [facility] 10/06/2022 10:48:17 AM PLEASE NOTE: All timestamps contained within this report are represented as [place] Standard Time. CONFIDENTIALTY NOTICE: This fax transmission is intended only for the addressee. It contains information that is legally privileged, confidential or otherwise protected from use or disclosure. If you are not the intended recipient, you are strictly prohibited from reviewing, disclosing, copying using or disseminating any of this information or taking any action in reliance on or regarding this information. If you have received this fax in error, please notify us immediately by telephone so that we can arrange for its return to [place]. Phone: [phone], Toll-Free: [phone], Fax: [phone] Page: 2 of 2 Call Id: [id number] [place]. Time Eilene Ghazi Time) Disposition Final User 10/06/2022 10:39:53 AM Send to Urgent Sylvan Cheese 10/06/2022 10:51:37 AM Go to ED Now Yes Kirk Ruths, RN, [facility] Final  Disposition 10/06/2022 10:51:37 AM Go to ED Now Yes Kirk Ruths, RN, Liana Gerold Disagree/Comply Disagree Caller Understands Yes PreDisposition Call Doctor Care Advice Given Per Guideline GO TO ED NOW: * You need to be seen in the Emergency Department. * Go to the ED at ___________ [place] now. Drive carefully. NOTE TO TRIAGER - DRIVING: * Another adult should drive. * Patient should not delay going to the emergency department. * If immediate transportation is not available via car, rideshare (e.g., Lyft, Uber), or taxi, then the patient should be instructed to call EMS-911. CARE ADVICE given per Breathing Difficulty (Adult) guideline. Comments User: Tawni Levy, RN Date/Time Eilene Ghazi Time): 10/06/2022 10:51:36 AM Patient states surgeon referred him to PCP PCP has no appt and he doesnt want to go to ER so he is goind to [place] REFUSED

## 2022-10-06 NOTE — Telephone Encounter (Signed)
Called him back- he notes he went to his wife's office and they did a chest x-ray- pt reports it was clear  He had shoulder surgery on 10/31; he had a scope.  Seemed to go well  Pt notes he seems to have expiratory wheezing only; he has noticed this for 4-5 days, it is getting worse  He denies any CP or SOB, no fever He othewise feels just fine  Did not take a covid test as of yet  Advised him a PE is possible, recommended an ER eval which he declines at this time   8:30 am lab appt with [place] tomorrow for D dimer and BMP  Appt at [facility] with his wife who is a PA and discussed the above plan. She states understanding

## 2022-10-07 ENCOUNTER — Other Ambulatory Visit (INDEPENDENT_AMBULATORY_CARE_PROVIDER_SITE_OTHER): Payer: 59

## 2022-10-07 ENCOUNTER — Encounter: Payer: Self-pay | Admitting: Family Medicine

## 2022-10-07 ENCOUNTER — Encounter: Payer: Self-pay | Admitting: Internal Medicine

## 2022-10-07 ENCOUNTER — Ambulatory Visit: Payer: 59 | Admitting: Internal Medicine

## 2022-10-07 ENCOUNTER — Other Ambulatory Visit (HOSPITAL_BASED_OUTPATIENT_CLINIC_OR_DEPARTMENT_OTHER): Payer: Self-pay

## 2022-10-07 VITALS — BP 131/82 | HR 109 | Temp 99.2°F | Resp 12 | Ht 70.0 in | Wt 204.4 lb

## 2022-10-07 DIAGNOSIS — E876 Hypokalemia: Secondary | ICD-10-CM | POA: Diagnosis not present

## 2022-10-07 DIAGNOSIS — R062 Wheezing: Secondary | ICD-10-CM

## 2022-10-07 DIAGNOSIS — J0411 Acute tracheitis with obstruction: Secondary | ICD-10-CM | POA: Diagnosis not present

## 2022-10-07 LAB — BASIC METABOLIC PANEL
BUN: 18 mg/dL (ref 6–23)
CO2: 28 mEq/L (ref 19–32)
Calcium: 9.1 mg/dL (ref 8.4–10.5)
Chloride: 102 mEq/L (ref 96–112)
Creatinine, Ser: 1.25 mg/dL (ref 0.40–1.50)
GFR: 70.87 mL/min (ref >60.00)
Glucose, Bld: 98 mg/dL (ref 70–99)
Potassium: 4.3 mEq/L (ref 3.5–5.1)
Sodium: 136 mEq/L (ref 135–145)

## 2022-10-07 LAB — D-DIMER, QUANTITATIVE: D-Dimer, Quant: 0.44 mcg/mL FEU (ref <0.50)

## 2022-10-07 MED ORDER — DOXYCYCLINE HYCLATE 100 MG PO TABS: 100.0000 mg | ORAL_TABLET | Freq: Two times a day (BID) | ORAL | 0 refills | Status: DC

## 2022-10-07 MED ORDER — SODIUM CHLORIDE 3 % IN NEBU: INHALATION_SOLUTION | RESPIRATORY_TRACT | 12 refills | Status: DC | PRN

## 2022-10-07 NOTE — Patient Instructions (Signed)
It was a pleasure seeing you today! I truly hope you feel like you received 5 star service and please let me know if there is anything I can improve.  Loralee Pacas, MD   Today the plan is...  Acute tracheitis with airway obstruction -     Doxycycline Hyclate; Take 1 tablet (100 mg total) by mouth 2 (two) times daily.  Dispense: 14 tablet; Refill: 0 -     Sodium Chloride; Take by nebulization as needed for other.  Dispense: 750 mL; Refill: 12         '[x]'$  RETURN TO CLINIC: No follow-ups on file.   - If you are not doing well: RETURN to the office sooner. - Please bring all your medicines to each appointment.  - If your condition begins to worsen or become severe:  GO to the ER.  '[x]'$  QUESTIONS/CONCERNS:  If you have follow-up questions / concerns:  - CLINICAL: please contact me via phone [phone] OR MyChart messaging  - [place] you will be contacted with the lab results as soon as they are available. For any labs or imaging tests, we will call you if the results are significantly abnormal.  Most normal results will be posted to myChart as soon as they are available and I will comment on them there within 2-3 business days.  The fastest way to get your results is to activate your My Chart account. Instructions are located on the last page of this paperwork. If you have not heard from [place] regarding the results in 2 weeks, please contact this office.  - BILLING: xray and lab orders are billed from separate companies and questions./concerns should be directed to the [facility].  For visit charges please discuss with our administrative services

## 2022-10-07 NOTE — Progress Notes (Signed)
[facility] at [employer]:  [phone]   Routine Medical Office Visit  Patient:  Brian [name]      Age: 41 y.o.       Sex:  male  Date:   10/07/2022  PCP:    Darreld Mclean, MD    [place] Provider: Loralee Pacas, MD  Assessment/Plan:   Brian [name] was seen today for fever, noisy breathing and possible blood clot.  Acute tracheitis with airway obstruction -     Doxycycline Hyclate; Take 1 tablet (100 mg total) by mouth 2 (two) times daily.  Dispense: 14 tablet; Refill: 0 -     Sodium Chloride; Take by nebulization as needed for other.  Dispense: 750 mL; Refill: 12  I think he has infection around where the balloon was inflated for the procedure, based on prominent laryngeal gurgling and his sensation of the symptoms being localized in upper sternum, but CXR being negative and cough being nonproductive somewhat ruling out aspiration pneumonia.  I.e. I believe he has infection of trachea from perioperative aspirations without progression to lower lung.  Chose doxycycline to try to get some MRSA coverage.   He is gurgling and coughing, but not wheezing or productive coughing... so will try to help him to clear any tracheal infection with saline nebs.  AI glass health analysis was created and reviewed with the patient and modified the output: Comprehensive Review of the Case: The patient is a 41 year old man who underwent a tendon repair surgery for a rotator cuff injury on his right shoulder about 9 days ago. He has been experiencing respiratory symptoms for the past 6 or 7 days, including a raspy, hoarse voice, a gurgling sound when breathing, and a dry cough. These symptoms are more pronounced when he lies down flat or on his left side. He denies any chest pain. He also reports feeling unwell, fatigued, and having muscle aches and a subjective fever last night. His primary care doctor tested for a blood clot due to the reported breathing sounds, but the D-dimer test  was negative. He also denies any swelling or pain in his calves. A chest x-ray conducted yesterday was clear. The patient's medications, allergies, social history, physical examination, laboratory data, course, and imaging data & other studies are not mentioned in the summary.  Most Likely Diagnosis (per AI):  Postoperative atelectasis could be the most likely diagnosis for this patient. Atelectasis is a common postoperative complication and can occur as a result of reduced mobility and shallow breathing after surgery. The patient's symptoms of a gurgling sound when breathing, dry cough, and the symptoms being more pronounced when lying flat could be indicative of atelectasis. I encouraged deep breathing exercises to address this.  Expanded DDx:  Pneumonia could also explain the patient's symptoms. Postoperative patients are at an increased risk of developing pneumonia, and the patient's symptoms of a gurgling sound when breathing, dry cough, fatigue, muscle aches, and subjective fever could be indicative of this condition. The presence of abnormal lung sounds on auscultation, elevated white blood cell count, or abnormal findings on a chest x-ray could further suggest this diagnosis.  CXR was negative though... and I felt like there is infection warranting antibiotics without repeating CXR.  Symptoms seem to prominent to be from an aspiration pneumonia that is too small to detect on xray.  Pulmonary embolism could be another possible diagnosis. Despite the negative D-dimer test, it is still possible for the patient to have a pulmonary embolism, especially  considering his recent surgery, which is a risk factor. The patient's symptoms of a gurgling sound when breathing and dry cough could be indicative of this condition. The presence of tachycardia, hypoxia, or abnormal findings on a CT pulmonary angiogram could further suggest this diagnosis.  However, he has no elevated heart rate, leg swelling, or shortness  of breath or hypoxia  Alternative DDx:  Bronchitis could explain the patient's symptoms. The patient's symptoms of a gurgling sound when breathing, dry cough, and fatigue could be indicative of bronchitis. The presence of wheezing or prolonged expiration on physical examination could further suggest this diagnosis... but he does not exhibit these symptoms.    Laryngitis could also explain the patient's symptoms. The patient's symptoms of a raspy, hoarse voice and dry cough could be indicative of laryngitis. The presence of vocal cord inflammation or erythema on laryngoscopy could further suggest this diagnosis... but I don't have access to laryngoscopy at this time.  Gastroesophageal reflux disease (GERD) could be another possible diagnosis. The patient's symptoms of a raspy, hoarse voice and symptoms being more pronounced when lying flat could be indicative of GERD. The presence of heartburn, regurgitation, or abnormal findings on an upper endoscopy could further suggest this diagnosis - but endoscopy seems not worthwhile with absolutely no signs & symptoms of dyspepsia as he has.  Postnasal drip could explain the patient's symptoms. The patient's symptoms of a raspy, hoarse voice and dry cough could be indicative of postnasal drip. The presence of nasal congestion, rhinorrhea, could further suggest this diagnosis.  He denies any such congestion.   Asthma could also explain the patient's symptoms. The patient's symptoms of a gurgling sound when breathing and dry cough could be indicative of asthma. The presence of wheezing on physical examination, a history of atopy, or reversible airflow obstruction on spirometry could further suggest this diagnosis but he doesn't have these histories.  Vocal cord dysfunction could be another possible diagnosis. The patient's symptoms of a raspy, hoarse voice and symptoms being more pronounced when lying flat could be indicative of vocal cord dysfunction. The presence  of stridor, normal lung function tests, or abnormal findings on a laryngoscopy could further suggest this diagnosis.   Today's key discussion points - also in After Visit Summary (AVS) Common side effects, risks, benefits, and alternatives for medications and treatment plan prescribed today were discussed, and he expressed understanding of the given instructions.  Medication list was reconciled and patient instructions and summary information was documented and made available for him to review in the AVS (see AVS).  This note is also available to patient for review for accuracy and understanding. He was encouraged to contact our office by phone or message via MyChart if he has any questions or concerns regarding our treatment plan (see AVS).  We discussed red flag symptoms and signs in detail and when to call the office or go to ER if his condition worsens (see AFTER VISIT SUMMARY). He expressed understanding.  No barriers to understanding were identified     Subjective:   Brian [name] is a 40 y.o. male with PMH significant for: Past Medical History:  Diagnosis Date   Diverticulitis 2017     He is presenting today with: Chief Complaint  Patient presents with   Fever    Not currently. Was 102 last night, took tylenol with relief.   Noisy breathing    When exhaling only, makes a gurgling sound. Since last week after surgery.    Possible blood  clot    Post surgery. Labs were done this morning that were normal (in chart).     Additional physician-collected and physician-reviewed history: See Assessment/Plan section for today's problem updates to charted history (under Overview: and Assessment & Plan for each problem)  He had a surgery on October 31 or about 9 days ago for tendon repair rotator cuff on the right shoulder His primary care Dr. Lorelei Pont and looked for a blood clot due to he reported some noise the breathing gurgling sound and the D-dimer was negative for that this morning  he denies any swelling or pain in his calves He reports having a chest x-ray on November 8 that was clear although results not immediately available for review the respiratory symptoms have been going on 6 or 7 days to include raspy hoarse voice gurgling sound with breathing coughing up nothing at all just dry cough denies any chest pain happens more when he lies down flat or on the He also reports last night he had a fever subjectively with muscle aches and has not felt well kind of fatigued.  He is having a lot of cough but no shortness of breath comprehensive review of review of systems was completely negative otherwise Review of Systems  Constitutional:  Positive for fever (subjective last night) and malaise/fatigue. Negative for chills, diaphoresis and weight loss.  HENT:  Negative for congestion, ear pain, hearing loss, nosebleeds, sinus pain, sore throat and tinnitus.   Eyes:  Negative for blurred vision, double vision, photophobia, pain, discharge and redness.  Respiratory:  Positive for cough. Negative for hemoptysis, sputum production, shortness of breath, wheezing and stridor.   Cardiovascular:  Negative for chest pain, palpitations, orthopnea, claudication, leg swelling and PND.  Gastrointestinal:  Negative for abdominal pain, blood in stool, constipation, diarrhea, heartburn, melena, nausea and vomiting.  Genitourinary:  Negative for dysuria, flank pain, frequency, hematuria and urgency.  Musculoskeletal:  Positive for myalgias (last night during fever). Negative for back pain, falls, joint pain and neck pain.  Skin:  Negative for itching and rash.  Neurological:  Negative for dizziness, tingling, tremors, sensory change, speech change, focal weakness, seizures, loss of consciousness, weakness and headaches.  Endo/Heme/Allergies:  Negative for environmental allergies and polydipsia. Does not bruise/bleed easily.  Psychiatric/Behavioral:  Negative for depression, hallucinations, memory  loss, substance abuse and suicidal ideas. The patient is not nervous/anxious and does not have insomnia.           Objective:  Physical Exam: BP 131/82 (BP Location: Left Arm, Patient Position: Sitting)   Pulse (!) 109   Temp 99.2 F (37.3 C) (Temporal)   Resp 12   Ht '5\' 10"'$  (1.778 m)   Wt 204 lb 6.4 oz (92.7 kg)   SpO2 95%   BMI 29.33 kg/m   He is a polite, friendly, and genuine person Constitutional: NAD, AAO, not ill-appearing Neuro: alert, no focal deficit obvious, articulate speech Psych: normal mood, behavior, thought content  Problem-specific physical exam findings:  No difficulty breathing, gurgling sound when he speaks.  Results Reviewed:  Results for orders placed or performed in visit on 33/29/51  Basic metabolic panel  Result Value Ref Range   Sodium 136 135 - 145 mEq/L   Potassium 4.3 3.5 - 5.1 mEq/L   Chloride 102 96 - 112 mEq/L   CO2 28 19 - 32 mEq/L   Glucose, Bld 98 70 - 99 mg/dL   BUN 18 6 - 23 mg/dL   Creatinine, Ser 1.25 0.40 - 1.50  mg/dL   GFR 70.87 >60.00 mL/min   Calcium 9.1 8.4 - 10.5 mg/dL  D-Dimer, Quantitative  Result Value Ref Range   D-Dimer, Quant 0.44 <0.50 mcg/mL FEU     Recent Results (from the past 2160 hour(s))  CBC with Differential     Status: Abnormal   Collection Time: 07/31/22  1:15 PM  Result Value Ref Range   WBC 14.4 (H) 4.0 - 10.5 K/uL   RBC 5.47 4.22 - 5.81 MIL/uL   Hemoglobin 16.4 13.0 - 17.0 g/dL   HCT 48.0 39.0 - 52.0 %   MCV 87.8 80.0 - 100.0 fL   MCH 30.0 26.0 - 34.0 pg   MCHC 34.2 30.0 - 36.0 g/dL   RDW 12.8 11.5 - 15.5 %   Platelets 261 150 - 400 K/uL   nRBC 0.0 0.0 - 0.2 %   Neutrophils Relative % 49 %   Neutro Abs 7.2 1.7 - 7.7 K/uL   Lymphocytes Relative 37 %   Lymphs Abs 5.4 (H) 0.7 - 4.0 K/uL   Monocytes Relative 11 %   Monocytes Absolute 1.5 (H) 0.1 - 1.0 K/uL   Eosinophils Relative 1 %   Eosinophils Absolute 0.1 0.0 - 0.5 K/uL   Basophils Relative 1 %   Basophils Absolute 0.1 0.0 - 0.1 K/uL    WBC Morphology MORPHOLOGY UNREMARKABLE    RBC Morphology MORPHOLOGY UNREMARKABLE    Smear Review MORPHOLOGY UNREMARKABLE    Immature Granulocytes 1 %   Abs Immature Granulocytes 0.11 (H) 0.00 - 0.07 K/uL    Comment: Performed at [facility], [address], [place], [place] [address]  Comprehensive metabolic panel     Status: Abnormal   Collection Time: 07/31/22  1:15 PM  Result Value Ref Range   Sodium 144 135 - 145 mmol/L   Potassium 3.1 (L) 3.5 - 5.1 mmol/L   Chloride 112 (H) 98 - 111 mmol/L   CO2 21 (L) 22 - 32 mmol/L   Glucose, Bld 151 (H) 70 - 99 mg/dL    Comment: Glucose reference range applies only to samples taken after fasting for at least 8 hours.   BUN 15 6 - 20 mg/dL   Creatinine, Ser 1.44 (H) 0.61 - 1.24 mg/dL   Calcium 8.6 (L) 8.9 - 10.3 mg/dL   Total Protein 5.8 (L) 6.5 - 8.1 g/dL   Albumin 3.9 3.5 - 5.0 g/dL   AST 29 15 - 41 U/L   ALT 27 0 - 44 U/L   Alkaline Phosphatase 65 38 - 126 U/L   Total Bilirubin 1.0 0.3 - 1.2 mg/dL   GFR, Estimated >60 >60 mL/min    Comment: (NOTE) Calculated using the CKD-EPI Creatinine Equation (2021)    Anion gap 11 5 - 15    Comment: Performed at [facility], [place] [address] [place], [place] [address]  Magnesium     Status: None   Collection Time: 07/31/22  1:15 PM  Result Value Ref Range   Magnesium 2.0 1.7 - 2.4 mg/dL    Comment: Performed at [facility] [address], [place], [place] [address]  I-stat chem 8, ED (not at [facility] or [facility])     Status: Abnormal   Collection Time: 07/31/22  1:59 PM  Result Value Ref Range   Sodium 145 135 - 145 mmol/L   Potassium 2.9 (L) 3.5 - 5.1 mmol/L   Chloride 109 98 - 111 mmol/L   BUN 16 6 - 20 mg/dL  Creatinine, Ser 1.50 (H) 0.61 - 1.24 mg/dL   Glucose, Bld 144 (H) 70 - 99 mg/dL    Comment: Glucose reference range applies only to samples taken after fasting for at least 8 hours.   Calcium, Ion 1.11 (L) 1.15 - 1.40 mmol/L   TCO2 24 22 - 32 mmol/L   Hemoglobin  16.0 13.0 - 17.0 g/dL   HCT 47.0 39.0 - 52.0 %  POCT rapid strep A     Status: None   Collection Time: 08/24/22  6:48 PM  Result Value Ref Range   Rapid Strep A Screen Negative Negative  Resp Panel by RT-PCR (Flu A&B, Covid) Anterior Nasal Swab     Status: None   Collection Time: 08/24/22  7:00 PM   Specimen: Anterior Nasal Swab  Result Value Ref Range   SARS Coronavirus 2 by RT PCR NEGATIVE NEGATIVE    Comment: (NOTE) SARS-CoV-2 target nucleic acids are NOT DETECTED.  The SARS-CoV-2 RNA is generally detectable in upper respiratory specimens during the acute phase of infection. The lowest concentration of SARS-CoV-2 viral copies this assay can detect is 138 copies/mL. A negative result does not preclude SARS-Cov-2 infection and should not be used as the sole basis for treatment or other patient management decisions. A negative result may occur with  improper specimen collection/handling, submission of specimen other than nasopharyngeal swab, presence of viral mutation(s) within the areas targeted by this assay, and inadequate number of viral copies(<138 copies/mL). A negative result must be combined with clinical observations, patient history, and epidemiological information. The expected result is Negative.  Fact Sheet for Patients:  EntrepreneurPulse.com.au  Fact Sheet for Healthcare Providers:  IncredibleEmployment.be  This test is no t yet approved or cleared by the [place] FDA and  has been authorized for detection and/or diagnosis of SARS-CoV-2 by FDA under an Emergency Use Authorization (EUA). This EUA will remain  in effect (meaning this test can be used) for the duration of the COVID-19 declaration under Section 564(b)(1) of the Act, 21 U.S.C.section 360bbb-3(b)(1), unless the authorization is terminated  or revoked sooner.       Influenza A by PCR NEGATIVE NEGATIVE   Influenza B by PCR NEGATIVE NEGATIVE    Comment:  (NOTE) The Xpert Xpress SARS-CoV-2/FLU/RSV plus assay is intended as an aid in the diagnosis of influenza from Nasopharyngeal swab specimens and should not be used as a sole basis for treatment. Nasal washings and aspirates are unacceptable for Xpert Xpress SARS-CoV-2/FLU/RSV testing.  Fact Sheet for Patients: EntrepreneurPulse.com.au  Fact Sheet for Healthcare Providers: IncredibleEmployment.be  This test is not yet approved or cleared by the [place] FDA and has been authorized for detection and/or diagnosis of SARS-CoV-2 by FDA under an Emergency Use Authorization (EUA). This EUA will remain in effect (meaning this test can be used) for the duration of the COVID-19 declaration under Section 564(b)(1) of the Act, 21 U.S.C. section 360bbb-3(b)(1), unless the authorization is terminated or revoked.  Performed at [facility], [place] [address], [place], [place] [address]   Testosterone Total,Free,Bio, Males-([facility])     Status: None   Collection Time: 09/09/22  9:11 AM  Result Value Ref Range   Testosterone 651 250 - 827 ng/dL   Albumin 4.6 3.6 - 5.1 g/dL   Sex Hormone Binding 19 10 - 50 nmol/L   Testosterone, Free 139.7 46.0 - 224.0 pg/mL   Testosterone, Bioavailable 293.4 110.0 - 575.0 ng/dL  Basic metabolic panel     Status: None  Collection Time: 10/07/22  8:24 AM  Result Value Ref Range   Sodium 136 135 - 145 mEq/L   Potassium 4.3 3.5 - 5.1 mEq/L   Chloride 102 96 - 112 mEq/L   CO2 28 19 - 32 mEq/L   Glucose, Bld 98 70 - 99 mg/dL   BUN 18 6 - 23 mg/dL   Creatinine, Ser 1.25 0.40 - 1.50 mg/dL   GFR 70.87 >60.00 mL/min    Comment: Calculated using the CKD-EPI Creatinine Equation (2021)   Calcium 9.1 8.4 - 10.5 mg/dL  D-Dimer, Quantitative     Status: None   Collection Time: 10/07/22  8:35 AM  Result Value Ref Range   D-Dimer, Quant 0.44 <0.50 mcg/mL FEU    Comment: . The D-Dimer test is used frequently to exclude an  acute PE or DVT. In patients with a low to moderate clinical risk assessment and a D-Dimer result <0.50 mcg/mL FEU, the likelihood of a PE or DVT is very low. However, a thromboembolic event should not be excluded solely on the basis of the D-Dimer level. Increased levels of D-Dimer are associated with a PE, DVT, DIC, malignancies, inflammation, sepsis, surgery, trauma, pregnancy, and advancing patient age. [Jama 2006 11:295(2):199-207] . For additional information, please refer to: http://education.[facility].com/faq/FAQ149 (This link is being provided for informational/ educational purposes only) .          No images are attached to the encounter.

## 2022-10-07 NOTE — Addendum Note (Signed)
Addended by: Kelle Darting A on: 10/07/2022 08:35 AM   Modules accepted: Orders

## 2022-10-14 ENCOUNTER — Other Ambulatory Visit: Payer: Self-pay | Admitting: Family Medicine

## 2022-10-14 DIAGNOSIS — F4323 Adjustment disorder with mixed anxiety and depressed mood: Secondary | ICD-10-CM

## 2022-10-19 ENCOUNTER — Other Ambulatory Visit (HOSPITAL_BASED_OUTPATIENT_CLINIC_OR_DEPARTMENT_OTHER): Payer: Self-pay

## 2022-11-13 ENCOUNTER — Ambulatory Visit
Admission: EM | Admit: 2022-11-13 | Discharge: 2022-11-13 | Disposition: A | Discharge status: Home / Self Care | Payer: 59 | Attending: Emergency Medicine | Admitting: Emergency Medicine

## 2022-11-13 ENCOUNTER — Encounter: Payer: Self-pay | Admitting: Emergency Medicine

## 2022-11-13 DIAGNOSIS — H1032 Unspecified acute conjunctivitis, left eye: Secondary | ICD-10-CM

## 2022-11-13 MED ORDER — OFLOXACIN 0.3 % OP SOLN: 1.0000 [drp] | Freq: Four times a day (QID) | OPHTHALMIC | 0 refills | Status: DC

## 2022-11-13 NOTE — Discharge Instructions (Signed)
Use eye drops as prescribed and to completion Dispose of old contacts and wear glasses until you have finished course of antibiotic eye drops Wash pillow cases, wash hands regularly with soap and water, avoid touching your face and eyes, wash door handles, light switches, remotes and other objects you frequently touch Return or follow up with PCP if symptoms persists such as fever, chills, redness, swelling, eye pain, painful eye movements, vision changes, etc... 

## 2022-11-13 NOTE — ED Provider Notes (Signed)
[place]   [id number] 11/13/22 Arrival Time: 0938 Chief Complaint  Patient presents with   Conjunctivitis     SUBJECTIVE:  ARK AGRUSA is a 41 y.o. male who presented to the urgent care with a complaint of conjunctivitis to left eye for the past 2 days.  Denies a precipitating event, trauma, or close contacts with similar symptoms.  Has not tried any OTC medication for relief.  Denies any negative pain factors.  Denies similar symptoms in the past.  Denies fever, chills, nausea, vomiting,  itching, vision changes, double vision, FB sensation, periorbital erythema.     Denies contact lens use.    ROS: As per HPI.  All other pertinent ROS negative.     Past Medical History:  Diagnosis Date   Diverticulitis 2017   History reviewed. No pertinent surgical history. Allergies  Allergen Reactions   Bee Venom Anaphylaxis   No current facility-administered medications on file prior to encounter.   Current Outpatient Medications on File Prior to Encounter  Medication Sig Dispense Refill   sodium chloride HYPERTONIC 3 % nebulizer solution Take by nebulization as needed for other. 750 mL 12   testosterone cypionate (DEPOTESTOTERONE CYPIONATE) 100 MG/ML injection Inject 1 mL (100 mg total) into the muscle once a week. 10 mL 1   venlafaxine XR (EFFEXOR-XR) 150 MG 24 hr capsule TAKE 1 CAPSULE BY MOUTH DAILY WITH BREAKFAST. 90 capsule 4   amoxicillin (AMOXIL) 500 MG capsule Take 1 capsule (500 mg total) by mouth 2 (two) times daily. (Patient not taking: Reported on 10/07/2022) 20 capsule 0   cyclobenzaprine (FLEXERIL) 10 MG tablet Take 1 tablet (10 mg total) by mouth every 6 (six) to 8 (eight) hours as needed for spasms. (Patient not taking: Reported on 10/07/2022) 30 tablet 0   diphenhydrAMINE (BENADRYL) 25 MG tablet Take 1 tablet (25 mg total) by mouth every 6 (six) hours as needed. (Patient not taking: Reported on 10/07/2022) 30 tablet 0   doxycycline (VIBRA-TABS) 100 MG tablet  Take 1 tablet (100 mg total) by mouth 2 (two) times daily. 14 tablet 0   famotidine (PEPCID) 20 MG tablet Take 1 tablet (20 mg total) by mouth 2 (two) times daily. (Patient not taking: Reported on 10/07/2022) 10 tablet 0   naproxen (NAPROSYN) 500 MG tablet Take 1 tablet (500 mg total) by mouth 2 (two) times daily. (Patient not taking: Reported on 10/07/2022) 60 tablet 1   ondansetron (ZOFRAN) 4 MG tablet Take 1 tablet (4 mg total) by mouth every 6 (six) to 8 (eight) hours as needed for post op nausea. (Patient not taking: Reported on 10/07/2022) 10 tablet 0   oxyCODONE-acetaminophen (PERCOCET) 5-325 MG tablet Take 1 tablet by mouth every 4 (four) to 6 (six) hours as needed for pain. (Patient not taking: Reported on 10/07/2022) 15 tablet 0   predniSONE (DELTASONE) 50 MG tablet Take 1 tablet (50 mg total) by mouth daily. (Patient not taking: Reported on 10/07/2022) 5 tablet 0   Social History   Socioeconomic History   Marital status: Married    Spouse name: Not on file   Number of children: Not on file   Years of education: Not on file   Highest education level: Not on file  Occupational History   Not on file  Tobacco Use   Smoking status: Never   Smokeless tobacco: Never  Vaping Use   Vaping Use: Never used  Substance and Sexual Activity   Alcohol use: Yes    Alcohol/week: 8.0  standard drinks of alcohol    Types: 8 Cans of beer per week   Drug use: No   Sexual activity: Yes  Other Topics Concern   Not on file  Social History Narrative   Not on file   Social Determinants of Health   Financial Resource Strain: Not on file  Food Insecurity: Not on file  Transportation Needs: Not on file  Physical Activity: Not on file  Stress: Not on file  Social Connections: Not on file  Intimate Partner Violence: Not on file   Family History  Problem Relation Age of Onset   Heart attack Father    Diabetes Neg Hx    Hyperlipidemia Neg Hx    Hypertension Neg Hx    Sudden death Neg Hx    Colon  cancer Neg Hx    Stomach cancer Neg Hx    Pancreatic cancer Neg Hx    Esophageal cancer Neg Hx    Liver cancer Neg Hx     OBJECTIVE:    Visual Acuity  Right Eye Distance:   Left Eye Distance:   Bilateral Distance:    Right Eye Near:   Left Eye Near:    Bilateral Near:      Vitals:   11/13/22 1008 11/13/22 1009  BP: 116/76   Pulse: 78   Resp: 18   Temp: 98.5 F (36.9 C)   TempSrc: Oral   SpO2: 95%   Weight:  200 lb (90.7 kg)  Height:  '5\' 10"'$  (1.778 m)    Physical Exam Vitals and nursing note reviewed.  Constitutional:      General: He is not in acute distress.    Appearance: Normal appearance. He is normal weight. He is not ill-appearing, toxic-appearing or diaphoretic.  Eyes:     General: Lids are normal.        Left eye: No foreign body or discharge.     Extraocular Movements:     Left eye: Normal extraocular motion.     Conjunctiva/sclera:     Left eye: Hemorrhage present.     Comments: Left eye  Cardiovascular:     Rate and Rhythm: Normal rate and regular rhythm.     Pulses: Normal pulses.     Heart sounds: Normal heart sounds. No murmur heard.    No friction rub. No gallop.  Pulmonary:     Effort: Pulmonary effort is normal. No respiratory distress.     Breath sounds: Normal breath sounds. No stridor. No wheezing, rhonchi or rales.  Chest:     Chest wall: No tenderness.  Neurological:     Mental Status: He is alert and oriented to person, place, and time.       ASSESSMENT & PLAN:  1. Acute bacterial conjunctivitis of left eye     Meds ordered this encounter  Medications   ofloxacin (OCUFLOX) 0.3 % ophthalmic solution    Sig: Place 1 drop into the left eye 4 (four) times daily. For 7 days    Dispense:  5 mL    Refill:  0     Conjunctivitis Use eye drops as prescribed and to completion Dispose of old contacts and wear glasses until you have finished course of antibiotic eye drops Wash pillow cases, wash hands regularly with soap and  water, avoid touching your face and eyes, wash door handles, light switches, remotes and other objects you frequently touch Return or follow up with PCP if symptoms persists such as fever, chills, redness, swelling, eye pain,  painful eye movements, vision changes, etc...  Reviewed expectations re: course of current medical issues. Questions answered. Outlined signs and symptoms indicating need for more acute intervention. Patient verbalized understanding. After Visit Summary given.    Emerson Monte, FNP 11/13/22 1030

## 2022-11-13 NOTE — ED Triage Notes (Signed)
Patient c/o possible pink eye in left eye x 2 days.  Some redness and drainage out of eye.  Denies any OTC meds for eye.  Applies hot compresses to eye.

## 2022-12-03 ENCOUNTER — Other Ambulatory Visit (HOSPITAL_BASED_OUTPATIENT_CLINIC_OR_DEPARTMENT_OTHER): Payer: Self-pay

## 2022-12-03 ENCOUNTER — Other Ambulatory Visit: Payer: Self-pay | Admitting: Family Medicine

## 2022-12-03 DIAGNOSIS — E291 Testicular hypofunction: Secondary | ICD-10-CM

## 2022-12-03 MED ORDER — TESTOSTERONE CYPIONATE 100 MG/ML IM SOLN
100.0000 mg | INTRAMUSCULAR | 1 refills | Status: DC
  Filled 2022-12-03: qty 10, 30d supply, fill #0
  Filled 2023-02-02: qty 10, 30d supply, fill #1

## 2023-02-02 ENCOUNTER — Other Ambulatory Visit (HOSPITAL_BASED_OUTPATIENT_CLINIC_OR_DEPARTMENT_OTHER): Payer: Self-pay

## 2023-02-03 ENCOUNTER — Other Ambulatory Visit (HOSPITAL_BASED_OUTPATIENT_CLINIC_OR_DEPARTMENT_OTHER): Payer: Self-pay

## 2023-03-16 ENCOUNTER — Ambulatory Visit
Admission: EM | Admit: 2023-03-16 | Discharge: 2023-03-16 | Disposition: A | Discharge status: Home / Self Care | Payer: 59

## 2023-03-16 ENCOUNTER — Emergency Department (HOSPITAL_COMMUNITY): Payer: 59

## 2023-03-16 ENCOUNTER — Inpatient Hospital Stay (HOSPITAL_COMMUNITY)
Admission: EM | Admit: 2023-03-16 | Discharge: 2023-03-19 | DRG: 392 | Disposition: A | Discharge status: Home / Self Care | Payer: 59 | Attending: General Surgery | Admitting: General Surgery

## 2023-03-16 DIAGNOSIS — R1030 Lower abdominal pain, unspecified: Secondary | ICD-10-CM | POA: Diagnosis not present

## 2023-03-16 DIAGNOSIS — Z8719 Personal history of other diseases of the digestive system: Secondary | ICD-10-CM | POA: Diagnosis not present

## 2023-03-16 DIAGNOSIS — Z8249 Family history of ischemic heart disease and other diseases of the circulatory system: Secondary | ICD-10-CM

## 2023-03-16 DIAGNOSIS — R944 Abnormal results of kidney function studies: Secondary | ICD-10-CM | POA: Diagnosis present

## 2023-03-16 DIAGNOSIS — R100 Acute abdomen: Secondary | ICD-10-CM

## 2023-03-16 DIAGNOSIS — K429 Umbilical hernia without obstruction or gangrene: Secondary | ICD-10-CM | POA: Diagnosis present

## 2023-03-16 DIAGNOSIS — K5720 Diverticulitis of large intestine with perforation and abscess without bleeding: Principal | ICD-10-CM | POA: Diagnosis present

## 2023-03-16 DIAGNOSIS — E860 Dehydration: Secondary | ICD-10-CM | POA: Diagnosis present

## 2023-03-16 DIAGNOSIS — K5792 Diverticulitis of intestine, part unspecified, without perforation or abscess without bleeding: Principal | ICD-10-CM | POA: Diagnosis present

## 2023-03-16 DIAGNOSIS — R300 Dysuria: Secondary | ICD-10-CM | POA: Diagnosis present

## 2023-03-16 DIAGNOSIS — Z79899 Other long term (current) drug therapy: Secondary | ICD-10-CM | POA: Diagnosis not present

## 2023-03-16 LAB — CBC
HCT: 52.7 % — ABNORMAL HIGH (ref 39.0–52.0)
Hemoglobin: 17.8 g/dL — ABNORMAL HIGH (ref 13.0–17.0)
MCH: 29.6 pg (ref 26.0–34.0)
MCHC: 33.8 g/dL (ref 30.0–36.0)
MCV: 87.5 fL (ref 80.0–100.0)
Platelets: 212 10*3/uL (ref 150–400)
RBC: 6.02 MIL/uL — ABNORMAL HIGH (ref 4.22–5.81)
RDW: 13.2 % (ref 11.5–15.5)
WBC: 18.1 10*3/uL — ABNORMAL HIGH (ref 4.0–10.5)
nRBC: 0 % (ref 0.0–0.2)

## 2023-03-16 LAB — LIPASE, BLOOD: Lipase: 26 U/L (ref 11–51)

## 2023-03-16 LAB — URINALYSIS, ROUTINE W REFLEX MICROSCOPIC
Bacteria, UA: NONE SEEN
Bilirubin Urine: NEGATIVE
Glucose, UA: NEGATIVE mg/dL
Hgb urine dipstick: NEGATIVE
Ketones, ur: 20 mg/dL — AB
Leukocytes,Ua: NEGATIVE
Nitrite: NEGATIVE
Protein, ur: 30 mg/dL — AB
Specific Gravity, Urine: 1.028 (ref 1.005–1.030)
pH: 7 (ref 5.0–8.0)

## 2023-03-16 LAB — COMPREHENSIVE METABOLIC PANEL
ALT: 38 U/L (ref 0–44)
AST: 31 U/L (ref 15–41)
Albumin: 4.2 g/dL (ref 3.5–5.0)
Alkaline Phosphatase: 70 U/L (ref 38–126)
Anion gap: 13 (ref 5–15)
BUN: 17 mg/dL (ref 6–20)
CO2: 22 mmol/L (ref 22–32)
Calcium: 9 mg/dL (ref 8.9–10.3)
Chloride: 103 mmol/L (ref 98–111)
Creatinine, Ser: 1.25 mg/dL — ABNORMAL HIGH (ref 0.61–1.24)
GFR, Estimated: >60 mL/min (ref >60)
Glucose, Bld: 81 mg/dL (ref 70–99)
Potassium: 3.8 mmol/L (ref 3.5–5.1)
Sodium: 138 mmol/L (ref 135–145)
Total Bilirubin: 2 mg/dL — ABNORMAL HIGH (ref 0.3–1.2)
Total Protein: 6.8 g/dL (ref 6.5–8.1)

## 2023-03-16 MED ORDER — DIPHENHYDRAMINE HCL 50 MG/ML IJ SOLN: 25.0000 mg | Freq: Four times a day (QID) | INTRAMUSCULAR | Status: DC | PRN

## 2023-03-16 MED ORDER — SIMETHICONE 80 MG PO CHEW: 40.0000 mg | CHEWABLE_TABLET | Freq: Four times a day (QID) | ORAL | Status: DC | PRN

## 2023-03-16 MED ORDER — HYDRALAZINE HCL 20 MG/ML IJ SOLN: 10.0000 mg | INTRAMUSCULAR | Status: DC | PRN

## 2023-03-16 MED ORDER — ONDANSETRON HCL 4 MG/2ML IJ SOLN: 4.0000 mg | Freq: Four times a day (QID) | INTRAMUSCULAR | Status: DC | PRN

## 2023-03-16 MED ORDER — ENOXAPARIN SODIUM 40 MG/0.4ML IJ SOSY
40.0000 mg | PREFILLED_SYRINGE | INTRAMUSCULAR | Status: DC
  Administered 2023-03-16: 40 mg via SUBCUTANEOUS
  Filled 2023-03-16: qty 0.4

## 2023-03-16 MED ORDER — PIPERACILLIN-TAZOBACTAM 3.375 G IVPB
3.3750 g | Freq: Three times a day (TID) | INTRAVENOUS | Status: DC
  Administered 2023-03-17 – 2023-03-19 (×7): 3.375 g via INTRAVENOUS
  Filled 2023-03-16 (×7): qty 50

## 2023-03-16 MED ORDER — OXYCODONE HCL 5 MG PO TABS: 5.0000 mg | ORAL_TABLET | Freq: Four times a day (QID) | ORAL | Status: DC | PRN

## 2023-03-16 MED ORDER — IBUPROFEN 200 MG PO TABS
600.0000 mg | ORAL_TABLET | Freq: Four times a day (QID) | ORAL | Status: DC | PRN
  Administered 2023-03-16: 600 mg via ORAL
  Filled 2023-03-16: qty 3

## 2023-03-16 MED ORDER — ACETAMINOPHEN 500 MG PO TABS
1000.0000 mg | ORAL_TABLET | Freq: Once | ORAL | Status: AC
  Administered 2023-03-16: 1000 mg via ORAL
  Filled 2023-03-16: qty 2

## 2023-03-16 MED ORDER — LACTATED RINGERS IV SOLN: INTRAVENOUS | Status: DC

## 2023-03-16 MED ORDER — LACTATED RINGERS IV BOLUS
1000.0000 mL | Freq: Once | INTRAVENOUS | Status: AC
  Administered 2023-03-16: 1000 mL via INTRAVENOUS

## 2023-03-16 MED ORDER — PIPERACILLIN-TAZOBACTAM 3.375 G IVPB 30 MIN
3.3750 g | Freq: Once | INTRAVENOUS | Status: AC
  Administered 2023-03-16: 3.375 g via INTRAVENOUS
  Filled 2023-03-16: qty 50

## 2023-03-16 MED ORDER — HYDROMORPHONE HCL 1 MG/ML IJ SOLN: 0.5000 mg | INTRAMUSCULAR | Status: DC | PRN

## 2023-03-16 MED ORDER — FAMOTIDINE 20 MG PO TABS: 20.0000 mg | ORAL_TABLET | Freq: Two times a day (BID) | ORAL | Status: DC

## 2023-03-16 MED ORDER — ACETAMINOPHEN 500 MG PO TABS
1000.0000 mg | ORAL_TABLET | Freq: Four times a day (QID) | ORAL | Status: DC
  Administered 2023-03-17 – 2023-03-18 (×7): 1000 mg via ORAL
  Filled 2023-03-16 (×9): qty 2

## 2023-03-16 MED ORDER — ONDANSETRON 4 MG PO TBDP: 4.0000 mg | ORAL_TABLET | Freq: Four times a day (QID) | ORAL | Status: DC | PRN

## 2023-03-16 MED ORDER — DIPHENHYDRAMINE HCL 25 MG PO CAPS: 25.0000 mg | ORAL_CAPSULE | Freq: Four times a day (QID) | ORAL | Status: DC | PRN

## 2023-03-16 NOTE — ED Triage Notes (Addendum)
Pt states lower abdominal pain worse on the right since last night.  Denies N/V/D. Abdomen soft tender to touch.

## 2023-03-16 NOTE — ED Triage Notes (Signed)
Patient here evaluation diffuse lower abdominal pain that started last night. Denies nausea, vomiting, and diarrhea. Patient does report dysuria. Patient is alert, oriented, and in no apparent distress at this time.

## 2023-03-16 NOTE — H&P (Signed)
CC/Reason for admission: Diverticulitis  HPI: Brian [name] is an 42 y.o. male with reported hx of diverticulitis (2017), managed successfully with PO abx, presented to ED today with lower abdominal pain that began yesterday.  Pain is sharp and persistent, nonradiating.  He denies any pain elsewhere in his abdomen. Nothing seems to make it better or worse.  He denies any nausea or vomiting. Has had fever today.   Has noted some burning with urination - no air or particulate in urine  Colonoscopy - Dr. Barron Alvine 01/2022 - One 5 mm polyp in the descending colon, removed with a cold snare. Resected and retrieved. - Non-bleeding internal hemorrhoids - Normal TI  PATH - hyperplastic polyp  CT A/P 07/2016 Focal inflammatory process at the mid descending colon with wall thickening and pericolic infiltration extending to lateral conal fascia, favor mild diverticulitis.   Small umbilical hernia.  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  His wife is at bedside and works as a Psychologist, sport and exercise  Past Medical History:  Diagnosis Date   Diverticulitis 2017    No past surgical history on file.  Family History  Problem Relation Age of Onset   Heart attack Father    Diabetes Neg Hx    Hyperlipidemia Neg Hx    Hypertension Neg Hx    Sudden death Neg Hx    Colon cancer Neg Hx    Stomach cancer Neg Hx    Pancreatic cancer Neg Hx    Esophageal cancer Neg Hx    Liver cancer Neg Hx     Social:  reports that he has never smoked. He has never used smokeless tobacco. He reports current alcohol use of about 8.0 standard drinks of alcohol per week. He reports that he does not use drugs.  Allergies:  Allergies  Allergen Reactions   Bee Venom Anaphylaxis    Medications: I have reviewed the patient's current medications.  Results for orders placed or performed during the hospital encounter of 03/16/23 (from the past 48 hour(s))  Lipase, blood     Status: None    Collection Time: 03/16/23  3:56 PM  Result Value Ref Range   Lipase 26 11 - 51 U/L    Comment: Performed at [facility], [address], [place], [place] [address]  Comprehensive metabolic panel     Status: Abnormal   Collection Time: 03/16/23  3:56 PM  Result Value Ref Range   Sodium 138 135 - 145 mmol/L   Potassium 3.8 3.5 - 5.1 mmol/L   Chloride 103 98 - 111 mmol/L   CO2 22 22 - 32 mmol/L   Glucose, Bld 81 70 - 99 mg/dL    Comment: Glucose reference range applies only to samples taken after fasting for at least 8 hours.   BUN 17 6 - 20 mg/dL   Creatinine, Ser 6.04 (H) 0.61 - 1.24 mg/dL   Calcium 9.0 8.9 - 54.0 mg/dL   Total Protein 6.8 6.5 - 8.1 g/dL   Albumin 4.2 3.5 - 5.0 g/dL   AST 31 15 - 41 U/L   ALT 38 0 - 44 U/L   Alkaline Phosphatase 70 38 - 126 U/L   Total Bilirubin 2.0 (H) 0.3 - 1.2 mg/dL   GFR, Estimated >98 >11 mL/min    Comment: (NOTE) Calculated using the CKD-EPI Creatinine Equation (2021)    Anion gap 13 5 - 15    Comment: Performed at [facility], [address] [place], [place] [address]  CBC     Status: Abnormal   Collection Time: 03/16/23  3:56 PM  Result Value Ref Range   WBC 18.1 (H) 4.0 - 10.5 K/uL   RBC 6.02 (H) 4.22 - 5.81 MIL/uL   Hemoglobin 17.8 (H) 13.0 - 17.0 g/dL   HCT 65.7 (H) 84.6 - 96.2 %   MCV 87.5 80.0 - 100.0 fL   MCH 29.6 26.0 - 34.0 pg   MCHC 33.8 30.0 - 36.0 g/dL   RDW 95.2 84.1 - 32.4 %   Platelets 212 150 - 400 K/uL   nRBC 0.0 0.0 - 0.2 %    Comment: Performed at [facility], [address], [place], [place] [address]  Urinalysis, Routine w reflex microscopic -Urine, Clean Catch     Status: Abnormal   Collection Time: 03/16/23  3:56 PM  Result Value Ref Range   Color, Urine AMBER (A) YELLOW    Comment: BIOCHEMICALS MAY BE AFFECTED BY COLOR   APPearance CLEAR CLEAR   Specific Gravity, Urine 1.028 1.005 - 1.030   pH 7.0 5.0 - 8.0   Glucose, UA NEGATIVE NEGATIVE mg/dL   Hgb urine dipstick NEGATIVE  NEGATIVE   Bilirubin Urine NEGATIVE NEGATIVE   Ketones, ur 20 (A) NEGATIVE mg/dL   Protein, ur 30 (A) NEGATIVE mg/dL   Nitrite NEGATIVE NEGATIVE   Leukocytes,Ua NEGATIVE NEGATIVE   RBC / HPF 0-5 0 - 5 RBC/hpf   WBC, UA 0-5 0 - 5 WBC/hpf   Bacteria, UA NONE SEEN NONE SEEN   Squamous Epithelial / HPF 0-5 0 - 5 /HPF   Mucus PRESENT     Comment: Performed at [facility], [address], [place], [place] [address]    CT Renal Stone Study  Result Date: 03/16/2023 CLINICAL DATA:  Lower abdominal pain worse on the right. EXAM: CT ABDOMEN AND PELVIS WITHOUT CONTRAST TECHNIQUE: Multidetector CT imaging of the abdomen and pelvis was performed following the standard protocol without IV contrast. RADIATION DOSE REDUCTION: This exam was performed according to the departmental dose-optimization program which includes automated exposure control, adjustment of the mA and/or kV according to patient size and/or use of iterative reconstruction technique. COMPARISON:  CT abdomen and pelvis 08/13/2016 FINDINGS: Lower chest: Mild atelectasis or scarring in the lung bases. No pleural effusion. Hepatobiliary: Diffusely decreased attenuation of the liver compatible with steatosis. Unchanged 6 mm hypodense focus in the right hepatic lobe, too small to fully characterize but with stability compatible with a benign etiology such as a cyst. Unremarkable gallbladder. No biliary dilatation. Pancreas: Unremarkable. Spleen: Unremarkable. Adrenals/Urinary Tract: Unremarkable adrenal glands. Possible 1 mm renal calculi bilaterally. No hydronephrosis or ureteral calculi. Nondistended bladder. Stomach/Bowel: The stomach is unremarkable. There is no evidence of bowel obstruction. There is left-sided colonic diverticulosis, and there are inflammatory changes involving a diverticulum of the sigmoid colon with regional colonic wall thickening. Several tiny foci of extraluminal gas are noted superior to this inflamed diverticulum. The  appendix is unremarkable. Vascular/Lymphatic: Normal caliber of the abdominal aorta. No enlarged lymph nodes. Reproductive: Unremarkable prostate. Other: No intraperitoneal free fluid or fluid collection. Small fat-containing umbilical hernia. Musculoskeletal: No acute osseous abnormality or suspicious osseous lesion. IMPRESSION: 1. Acute sigmoid diverticulitis with microperforation. 2. Hepatic steatosis. 3. Possible tiny nonobstructing bilateral renal calculi. Electronically Signed   By: Sebastian Ache M.D.   On: 03/16/2023 16:42    ROS - all of the below systems have been reviewed with the patient  and positives are indicated with bold text  General: chills, fever or night sweats GI: as per HPI GU: dysuria, trouble voiding, hematuria, pneumaturia   PE Blood pressure 132/62, pulse (!) 124, temperature (!) 101.9 F (38.8 C), resp. rate (!) 22, SpO2 96 %. Constitutional: NAD; conversant Eyes: Moist conjunctiva Lungs: Normal respiratory effort CV: tachycardic rate, reg rhythm GI: Abd soft, focally ttp in hypogastrium, no tenderness elsewhere; nondistended; no rebound nor guarding. Psychiatric: Appropriate affect  Results for orders placed or performed during the hospital encounter of 03/16/23 (from the past 48 hour(s))  Lipase, blood     Status: None   Collection Time: 03/16/23  3:56 PM  Result Value Ref Range   Lipase 26 11 - 51 U/L    Comment: Performed at [facility], [address], [place], [place] [address]  Comprehensive metabolic panel     Status: Abnormal   Collection Time: 03/16/23  3:56 PM  Result Value Ref Range   Sodium 138 135 - 145 mmol/L   Potassium 3.8 3.5 - 5.1 mmol/L   Chloride 103 98 - 111 mmol/L   CO2 22 22 - 32 mmol/L   Glucose, Bld 81 70 - 99 mg/dL    Comment: Glucose reference range applies only to samples taken after fasting for at least 8 hours.   BUN 17 6 - 20 mg/dL   Creatinine, Ser 6.04 (H) 0.61 - 1.24 mg/dL   Calcium 9.0 8.9 - 54.0 mg/dL   Total  Protein 6.8 6.5 - 8.1 g/dL   Albumin 4.2 3.5 - 5.0 g/dL   AST 31 15 - 41 U/L   ALT 38 0 - 44 U/L   Alkaline Phosphatase 70 38 - 126 U/L   Total Bilirubin 2.0 (H) 0.3 - 1.2 mg/dL   GFR, Estimated >98 >11 mL/min    Comment: (NOTE) Calculated using the CKD-EPI Creatinine Equation (2021)    Anion gap 13 5 - 15    Comment: Performed at [facility], [address] [place], [place] [address]  CBC     Status: Abnormal   Collection Time: 03/16/23  3:56 PM  Result Value Ref Range   WBC 18.1 (H) 4.0 - 10.5 K/uL   RBC 6.02 (H) 4.22 - 5.81 MIL/uL   Hemoglobin 17.8 (H) 13.0 - 17.0 g/dL   HCT 29.5 (H) 62.1 - 30.8 %   MCV 87.5 80.0 - 100.0 fL   MCH 29.6 26.0 - 34.0 pg   MCHC 33.8 30.0 - 36.0 g/dL   RDW 65.7 84.6 - 96.2 %   Platelets 212 150 - 400 K/uL   nRBC 0.0 0.0 - 0.2 %    Comment: Performed at [facility], [address], [place], [place] [address]  Urinalysis, Routine w reflex microscopic -Urine, Clean Catch     Status: Abnormal   Collection Time: 03/16/23  3:56 PM  Result Value Ref Range   Color, Urine AMBER (A) YELLOW    Comment: BIOCHEMICALS MAY BE AFFECTED BY COLOR   APPearance CLEAR CLEAR   Specific Gravity, Urine 1.028 1.005 - 1.030   pH 7.0 5.0 - 8.0   Glucose, UA NEGATIVE NEGATIVE mg/dL   Hgb urine dipstick NEGATIVE NEGATIVE   Bilirubin Urine NEGATIVE NEGATIVE   Ketones, ur 20 (A) NEGATIVE mg/dL   Protein, ur 30 (A) NEGATIVE mg/dL   Nitrite NEGATIVE NEGATIVE   Leukocytes,Ua NEGATIVE NEGATIVE   RBC / HPF 0-5 0 - 5 RBC/hpf   WBC, UA 0-5  0 - 5 WBC/hpf   Bacteria, UA NONE SEEN NONE SEEN   Squamous Epithelial / HPF 0-5 0 - 5 /HPF   Mucus PRESENT     Comment: Performed at [facility], [address], [place], [place] [address]    CT Renal Stone Study  Result Date: 03/16/2023 CLINICAL DATA:  Lower abdominal pain worse on the right. EXAM: CT ABDOMEN AND PELVIS WITHOUT CONTRAST TECHNIQUE: Multidetector CT imaging of the abdomen and pelvis was performed  following the standard protocol without IV contrast. RADIATION DOSE REDUCTION: This exam was performed according to the departmental dose-optimization program which includes automated exposure control, adjustment of the mA and/or kV according to patient size and/or use of iterative reconstruction technique. COMPARISON:  CT abdomen and pelvis 08/13/2016 FINDINGS: Lower chest: Mild atelectasis or scarring in the lung bases. No pleural effusion. Hepatobiliary: Diffusely decreased attenuation of the liver compatible with steatosis. Unchanged 6 mm hypodense focus in the right hepatic lobe, too small to fully characterize but with stability compatible with a benign etiology such as a cyst. Unremarkable gallbladder. No biliary dilatation. Pancreas: Unremarkable. Spleen: Unremarkable. Adrenals/Urinary Tract: Unremarkable adrenal glands. Possible 1 mm renal calculi bilaterally. No hydronephrosis or ureteral calculi. Nondistended bladder. Stomach/Bowel: The stomach is unremarkable. There is no evidence of bowel obstruction. There is left-sided colonic diverticulosis, and there are inflammatory changes involving a diverticulum of the sigmoid colon with regional colonic wall thickening. Several tiny foci of extraluminal gas are noted superior to this inflamed diverticulum. The appendix is unremarkable. Vascular/Lymphatic: Normal caliber of the abdominal aorta. No enlarged lymph nodes. Reproductive: Unremarkable prostate. Other: No intraperitoneal free fluid or fluid collection. Small fat-containing umbilical hernia. Musculoskeletal: No acute osseous abnormality or suspicious osseous lesion. IMPRESSION: 1. Acute sigmoid diverticulitis with microperforation. 2. Hepatic steatosis. 3. Possible tiny nonobstructing bilateral renal calculi. Electronically Signed   By: Sebastian Ache M.D.   On: 03/16/2023 16:42    A/P: Brian [name] is an 42 y.o. male with acute diverticulitis - microperforation  -Tachycardic and febrile; WBC 18;  no evidence of peritonitis on exam or significant free fluid on CT - started IV zosyn, monitor -Admit to med surg -Sips liquids -Ppx: Lov, SCDs  We reviewed relevant anatomy and his CT imaging with him at bedside. We discussed plan above. Questions answered  Moderate MDM. I spent a total of 55 minutes in both face-to-face and non-face-to-face activities, excluding procedures performed, for this visit on the date of this encounter.   Marin Olp, MD [facility], [facility]

## 2023-03-16 NOTE — Discharge Instructions (Addendum)
Please head straight to the emergency room now as I am concerned that you have an acute abdomen, peritonitis and diverticulitis. Do not eat or drink anything else. Go straight to the emergency at [facility].

## 2023-03-16 NOTE — ED Provider Notes (Addendum)
[facility]  Note:  This document was prepared using Conservation officer, historic buildings and may include unintentional dictation errors.  MRN: [id number] DOB: 1981-05-09  Subjective:   Brian [name] is a 42 y.o. male presenting for 1 day history of acute onset severe constant lower abdominal pain worse when he is walking or moving.  Patient has a remote history of diverticulitis from 2017.  Denies fever, nausea, vomiting, diarrhea, constipation, hematuria, dysuria, history of renal stones.  Had 2 bowel movements and did not notice any bloody stools.  No history of abdominal surgeries.  No current facility-administered medications for this encounter.  Current Outpatient Medications:    amoxicillin (AMOXIL) 500 MG capsule, Take 1 capsule (500 mg total) by mouth 2 (two) times daily. (Patient not taking: Reported on 10/07/2022), Disp: 20 capsule, Rfl: 0   cyclobenzaprine (FLEXERIL) 10 MG tablet, Take 1 tablet (10 mg total) by mouth every 6 (six) to 8 (eight) hours as needed for spasms. (Patient not taking: Reported on 10/07/2022), Disp: 30 tablet, Rfl: 0   diphenhydrAMINE (BENADRYL) 25 MG tablet, Take 1 tablet (25 mg total) by mouth every 6 (six) hours as needed. (Patient not taking: Reported on 10/07/2022), Disp: 30 tablet, Rfl: 0   doxycycline (VIBRA-TABS) 100 MG tablet, Take 1 tablet (100 mg total) by mouth 2 (two) times daily., Disp: 14 tablet, Rfl: 0   famotidine (PEPCID) 20 MG tablet, Take 1 tablet (20 mg total) by mouth 2 (two) times daily. (Patient not taking: Reported on 10/07/2022), Disp: 10 tablet, Rfl: 0   naproxen (NAPROSYN) 500 MG tablet, Take 1 tablet (500 mg total) by mouth 2 (two) times daily. (Patient not taking: Reported on 10/07/2022), Disp: 60 tablet, Rfl: 1   ofloxacin (OCUFLOX) 0.3 % ophthalmic solution, Place 1 drop into the left eye 4 (four) times daily. For 7 days, Disp: 5 mL, Rfl: 0   ondansetron (ZOFRAN) 4 MG tablet, Take 1 tablet (4 mg total) by mouth  every 6 (six) to 8 (eight) hours as needed for post op nausea. (Patient not taking: Reported on 10/07/2022), Disp: 10 tablet, Rfl: 0   oxyCODONE-acetaminophen (PERCOCET) 5-325 MG tablet, Take 1 tablet by mouth every 4 (four) to 6 (six) hours as needed for pain. (Patient not taking: Reported on 10/07/2022), Disp: 15 tablet, Rfl: 0   predniSONE (DELTASONE) 50 MG tablet, Take 1 tablet (50 mg total) by mouth daily. (Patient not taking: Reported on 10/07/2022), Disp: 5 tablet, Rfl: 0   sodium chloride HYPERTONIC 3 % nebulizer solution, Take by nebulization as needed for other., Disp: 750 mL, Rfl: 12   testosterone cypionate (DEPOTESTOTERONE CYPIONATE) 100 MG/ML injection, Inject 1 mL (100 mg total) into the muscle once a week., Disp: 10 mL, Rfl: 1   venlafaxine XR (EFFEXOR-XR) 150 MG 24 hr capsule, TAKE 1 CAPSULE BY MOUTH DAILY WITH BREAKFAST., Disp: 90 capsule, Rfl: 4   Allergies  Allergen Reactions   Bee Venom Anaphylaxis    Past Medical History:  Diagnosis Date   Diverticulitis 2017     History reviewed. No pertinent surgical history.  Family History  Problem Relation Age of Onset   Heart attack Father    Diabetes Neg Hx    Hyperlipidemia Neg Hx    Hypertension Neg Hx    Sudden death Neg Hx    Colon cancer Neg Hx    Stomach cancer Neg Hx    Pancreatic cancer Neg Hx    Esophageal cancer Neg Hx  Liver cancer Neg Hx     Social History   Tobacco Use   Smoking status: Never   Smokeless tobacco: Never  Vaping Use   Vaping Use: Never used  Substance Use Topics   Alcohol use: Yes    Alcohol/week: 8.0 standard drinks of alcohol    Types: 8 Cans of beer per week   Drug use: No    ROS   Objective:   Vitals: BP 130/72 (BP Location: Right Arm)   Pulse (!) 113   Temp 99.8 F (37.7 C) (Oral)   Resp 16   SpO2 96%   Physical Exam Constitutional:      General: He is not in acute distress.    Appearance: Normal appearance. He is well-developed and normal weight. He is not  ill-appearing, toxic-appearing or diaphoretic.  HENT:     Head: Normocephalic and atraumatic.     Right Ear: External ear normal.     Left Ear: External ear normal.     Nose: Nose normal.     Mouth/Throat:     Pharynx: Oropharynx is clear.  Eyes:     General: No scleral icterus.       Right eye: No discharge.        Left eye: No discharge.     Extraocular Movements: Extraocular movements intact.  Cardiovascular:     Rate and Rhythm: Normal rate.  Pulmonary:     Effort: Pulmonary effort is normal.  Abdominal:     General: Bowel sounds are normal. There is no distension.     Palpations: Abdomen is soft. There is no mass.     Tenderness: There is abdominal tenderness in the right lower quadrant, periumbilical area and left lower quadrant. There is guarding and rebound. There is no right CVA tenderness or left CVA tenderness.  Musculoskeletal:     Cervical back: Normal range of motion.  Neurological:     Mental Status: He is alert and oriented to person, place, and time.  Psychiatric:        Mood and Affect: Mood normal.        Behavior: Behavior normal.        Thought Content: Thought content normal.        Judgment: Judgment normal.     Assessment and Plan :   PDMP not reviewed this encounter.  1. Acute abdomen   2. Lower abdominal pain   3. History of diverticulitis     Patient has signs of an acute abdomen on exam with significant abdominal guarding and rebound tenderness.  Discussed the differential which includes peritonitis, diverticulitis, appendicitis, acute abdomen.  Also has possibility of obstructive uropathy but less likely given location of his pain and history of diverticulitis. Requires a higher level of care than we can provide in the urgent care setting.  Patient plans on presenting to the emergency room now for further management.  Plans on presenting by personal vehicle.  Declines pain medication in clinic.     Wallis Bamberg, [place] 03/16/23 1615

## 2023-03-16 NOTE — ED Notes (Signed)
ED TO INPATIENT HANDOFF REPORT  ED Nurse Name and Phone #: Delice Bison, RN  S Name/Age/Gender Brian [name] 42 y.o. male Room/Bed: 037C/037C  Code Status   Code Status: Full Code  Home/SNF/Other Home Patient oriented to: self, place, time, and situation Is this baseline? Yes   Triage Complete: Triage complete  Chief Complaint Diverticulitis [K57.92]  Triage Note Patient here evaluation diffuse lower abdominal pain that started last night. Denies nausea, vomiting, and diarrhea. Patient does report dysuria. Patient is alert, oriented, and in no apparent distress at this time.   Allergies Allergies  Allergen Reactions   Bee Venom Anaphylaxis    Level of Care/Admitting Diagnosis ED Disposition     ED Disposition  Admit   Condition  --   Comment  Hospital Area: [facility] [[id number]]  Level of Care: Med-Surg [16]  May admit patient to [facility] or [facility] if equivalent level of care is available:: No  Covid Evaluation: Asymptomatic - no recent exposure (last 10 days) testing not required  Diagnosis: Diverticulitis [191478]  Admitting Physician: CCS, MD [[id number]]  Attending Physician: CCS, MD [[id number]]  Certification:: I certify this patient will need inpatient services for at least 2 midnights  Estimated Length of Stay: 2          B Medical/Surgery History Past Medical History:  Diagnosis Date   Diverticulitis 2017   No past surgical history on file.   A IV Location/Drains/Wounds Patient Lines/Drains/Airways Status     Active Line/Drains/Airways     Name Placement date Placement time Site Days   Peripheral IV 03/16/23 20 G Left Forearm 03/16/23  1558  Forearm  less than 1            Intake/Output Last 24 hours No intake or output data in the 24 hours ending 03/16/23 1853  Labs/Imaging Results for orders placed or performed during the hospital encounter of 03/16/23 (from the past 48 hour(s))  Lipase, blood     Status: None    Collection Time: 03/16/23  3:56 PM  Result Value Ref Range   Lipase 26 11 - 51 U/L    Comment: Performed at [facility], [address], [place], [place] [address]  Comprehensive metabolic panel     Status: Abnormal   Collection Time: 03/16/23  3:56 PM  Result Value Ref Range   Sodium 138 135 - 145 mmol/L   Potassium 3.8 3.5 - 5.1 mmol/L   Chloride 103 98 - 111 mmol/L   CO2 22 22 - 32 mmol/L   Glucose, Bld 81 70 - 99 mg/dL    Comment: Glucose reference range applies only to samples taken after fasting for at least 8 hours.   BUN 17 6 - 20 mg/dL   Creatinine, Ser 1.30 (H) 0.61 - 1.24 mg/dL   Calcium 9.0 8.9 - 86.5 mg/dL   Total Protein 6.8 6.5 - 8.1 g/dL   Albumin 4.2 3.5 - 5.0 g/dL   AST 31 15 - 41 U/L   ALT 38 0 - 44 U/L   Alkaline Phosphatase 70 38 - 126 U/L   Total Bilirubin 2.0 (H) 0.3 - 1.2 mg/dL   GFR, Estimated >78 >46 mL/min    Comment: (NOTE) Calculated using the CKD-EPI Creatinine Equation (2021)    Anion gap 13 5 - 15    Comment: Performed at [facility], [address] [place], [place] [address]  CBC     Status: Abnormal   Collection Time:  03/16/23  3:56 PM  Result Value Ref Range   WBC 18.1 (H) 4.0 - 10.5 K/uL   RBC 6.02 (H) 4.22 - 5.81 MIL/uL   Hemoglobin 17.8 (H) 13.0 - 17.0 g/dL   HCT 14.7 (H) 82.9 - 56.2 %   MCV 87.5 80.0 - 100.0 fL   MCH 29.6 26.0 - 34.0 pg   MCHC 33.8 30.0 - 36.0 g/dL   RDW 13.0 86.5 - 78.4 %   Platelets 212 150 - 400 K/uL   nRBC 0.0 0.0 - 0.2 %    Comment: Performed at [facility], [address] [place], [place] [address]  Urinalysis, Routine w reflex microscopic -Urine, Clean Catch     Status: Abnormal   Collection Time: 03/16/23  3:56 PM  Result Value Ref Range   Color, Urine AMBER (A) YELLOW    Comment: BIOCHEMICALS MAY BE AFFECTED BY COLOR   APPearance CLEAR CLEAR   Specific Gravity, Urine 1.028 1.005 - 1.030   pH 7.0 5.0 - 8.0   Glucose, UA NEGATIVE NEGATIVE mg/dL   Hgb urine dipstick NEGATIVE  NEGATIVE   Bilirubin Urine NEGATIVE NEGATIVE   Ketones, ur 20 (A) NEGATIVE mg/dL   Protein, ur 30 (A) NEGATIVE mg/dL   Nitrite NEGATIVE NEGATIVE   Leukocytes,Ua NEGATIVE NEGATIVE   RBC / HPF 0-5 0 - 5 RBC/hpf   WBC, UA 0-5 0 - 5 WBC/hpf   Bacteria, UA NONE SEEN NONE SEEN   Squamous Epithelial / HPF 0-5 0 - 5 /HPF   Mucus PRESENT     Comment: Performed at [facility], [address], [place], [place] [address]   CT Renal Stone Study  Result Date: 03/16/2023 CLINICAL DATA:  Lower abdominal pain worse on the right. EXAM: CT ABDOMEN AND PELVIS WITHOUT CONTRAST TECHNIQUE: Multidetector CT imaging of the abdomen and pelvis was performed following the standard protocol without IV contrast. RADIATION DOSE REDUCTION: This exam was performed according to the departmental dose-optimization program which includes automated exposure control, adjustment of the mA and/or kV according to patient size and/or use of iterative reconstruction technique. COMPARISON:  CT abdomen and pelvis 08/13/2016 FINDINGS: Lower chest: Mild atelectasis or scarring in the lung bases. No pleural effusion. Hepatobiliary: Diffusely decreased attenuation of the liver compatible with steatosis. Unchanged 6 mm hypodense focus in the right hepatic lobe, too small to fully characterize but with stability compatible with a benign etiology such as a cyst. Unremarkable gallbladder. No biliary dilatation. Pancreas: Unremarkable. Spleen: Unremarkable. Adrenals/Urinary Tract: Unremarkable adrenal glands. Possible 1 mm renal calculi bilaterally. No hydronephrosis or ureteral calculi. Nondistended bladder. Stomach/Bowel: The stomach is unremarkable. There is no evidence of bowel obstruction. There is left-sided colonic diverticulosis, and there are inflammatory changes involving a diverticulum of the sigmoid colon with regional colonic wall thickening. Several tiny foci of extraluminal gas are noted superior to this inflamed diverticulum. The  appendix is unremarkable. Vascular/Lymphatic: Normal caliber of the abdominal aorta. No enlarged lymph nodes. Reproductive: Unremarkable prostate. Other: No intraperitoneal free fluid or fluid collection. Small fat-containing umbilical hernia. Musculoskeletal: No acute osseous abnormality or suspicious osseous lesion. IMPRESSION: 1. Acute sigmoid diverticulitis with microperforation. 2. Hepatic steatosis. 3. Possible tiny nonobstructing bilateral renal calculi. Electronically Signed   By: Sebastian Ache M.D.   On: 03/16/2023 16:42    Pending Labs [employer] (From admission, onward)     Start     Ordered   Signed and Held  HIV Antibody (routine testing w rflx)  (HIV Antibody (  Routine testing w reflex) panel)  Once,   R        Signed and Held   Signed and Held  Creatinine, serum  (enoxaparin (LOVENOX)    CrCl >/= 30 ml/min)  Weekly,   R     Comments: while on enoxaparin therapy    Signed and Held   Signed and Held  CBC WITH DIFFERENTIAL  Tomorrow morning,   R        Signed and Held   Signed and Held  Basic metabolic panel  Tomorrow morning,   R        Signed and Held            Vitals/Pain Today's Vitals   03/16/23 1707 03/16/23 1710 03/16/23 1730 03/16/23 1745  BP:   121/65 96/67  Pulse:  (!) 124 (!) 116 (!) 120  Resp:  (!) 22 (!) 22 17  Temp:      SpO2:  96% 93% 95%  PainSc: 6        Isolation Precautions No active isolations  Medications Medications  piperacillin-tazobactam (ZOSYN) IVPB 3.375 g (has no administration in time range)  acetaminophen (TYLENOL) tablet 1,000 mg (1,000 mg Oral Given 03/16/23 1635)  lactated ringers bolus 1,000 mL (1,000 mLs Intravenous New Bag/Given 03/16/23 1825)  piperacillin-tazobactam (ZOSYN) IVPB 3.375 g (0 g Intravenous Stopped 03/16/23 1851)    Mobility walks     Focused Assessments Cardiac Assessment Handoff:    No results found for: "CKTOTAL", "CKMB", "CKMBINDEX", "TROPONINI" Lab Results  Component Value Date   DDIMER 0.44  10/07/2022   Does the Patient currently have chest pain? No    R Recommendations: See Admitting Provider Note  Report given to:   Additional Notes:

## 2023-03-16 NOTE — ED Provider Notes (Signed)
[facility] AT [facility] Provider Note   CSN: [id number] Arrival date & time: 03/16/23  1523     History  Chief Complaint  Patient presents with   Abdominal Pain    Brian [name] is a 42 y.o. male.  With a history of diverticulitis who presents to the ED for evaluation of sudden onset lower abdominal pain.  This began yesterday evening and has progressively gotten worse.  Describes it as a constant severe ache.  It is diffuse across the lower abdomen and not worse on one side or the other.  He has had 2 nonbloody bowel movements today.  He states he felt like he was getting sick but denied specific febrile feeling.  He denies nausea, vomiting, diarrhea.  He states his pain is significantly worse than the last time he was diagnosed with diverticulitis.  He denies melena or hematochezia.  He also reports a stinging sensation when he urinates which began last night as well.   Abdominal Pain      Home Medications Prior to Admission medications   Medication Sig Start Date End Date Taking? Authorizing Provider  amoxicillin (AMOXIL) 500 MG capsule Take 1 capsule (500 mg total) by mouth 2 (two) times daily. Patient not taking: Reported on 10/07/2022 08/24/22   Wallis Bamberg, PA-C  cyclobenzaprine (FLEXERIL) 10 MG tablet Take 1 tablet (10 mg total) by mouth every 6 (six) to 8 (eight) hours as needed for spasms. Patient not taking: Reported on 10/07/2022 09/28/22     diphenhydrAMINE (BENADRYL) 25 MG tablet Take 1 tablet (25 mg total) by mouth every 6 (six) hours as needed. Patient not taking: Reported on 10/07/2022 07/31/22   Linwood Dibbles, MD  doxycycline (VIBRA-TABS) 100 MG tablet Take 1 tablet (100 mg total) by mouth 2 (two) times daily. 10/07/22   Lula Olszewski, MD  famotidine (PEPCID) 20 MG tablet Take 1 tablet (20 mg total) by mouth 2 (two) times daily. Patient not taking: Reported on 10/07/2022 07/31/22   Linwood Dibbles, MD  naproxen (NAPROSYN) 500 MG tablet Take 1  tablet (500 mg total) by mouth 2 (two) times daily. Patient not taking: Reported on 10/07/2022 09/28/22     ofloxacin (OCUFLOX) 0.3 % ophthalmic solution Place 1 drop into the left eye 4 (four) times daily. For 7 days 11/13/22   Avegno, Zachery Dakins, FNP  ondansetron (ZOFRAN) 4 MG tablet Take 1 tablet (4 mg total) by mouth every 6 (six) to 8 (eight) hours as needed for post op nausea. Patient not taking: Reported on 10/07/2022 09/28/22     oxyCODONE-acetaminophen (PERCOCET) 5-325 MG tablet Take 1 tablet by mouth every 4 (four) to 6 (six) hours as needed for pain. Patient not taking: Reported on 10/07/2022 09/28/22     predniSONE (DELTASONE) 50 MG tablet Take 1 tablet (50 mg total) by mouth daily. Patient not taking: Reported on 10/07/2022 07/31/22   Linwood Dibbles, MD  sodium chloride HYPERTONIC 3 % nebulizer solution Take by nebulization as needed for other. 10/07/22   Lula Olszewski, MD  testosterone cypionate (DEPOTESTOTERONE CYPIONATE) 100 MG/ML injection Inject 1 mL (100 mg total) into the muscle once a week. 12/03/22   Copland, Gwenlyn Found, MD  venlafaxine XR (EFFEXOR-XR) 150 MG 24 hr capsule TAKE 1 CAPSULE BY MOUTH DAILY WITH BREAKFAST. 10/14/22 10/14/23  Copland, Gwenlyn Found, MD      Allergies    Bee venom    Review of Systems   Review of Systems  Gastrointestinal:  Positive for abdominal pain.  All other systems reviewed and are negative.   Physical Exam Updated Vital Signs BP 96/67   Pulse (!) 120   Temp (!) 101.9 F (38.8 C)   Resp 17   SpO2 95%  Physical Exam Vitals and nursing note reviewed.  Constitutional:      General: He is not in acute distress.    Appearance: He is well-developed.     Comments: Resting comfortably in bed  HENT:     Head: Normocephalic and atraumatic.  Eyes:     Conjunctiva/sclera: Conjunctivae normal.  Cardiovascular:     Rate and Rhythm: Normal rate and regular rhythm.     Heart sounds: No murmur heard. Pulmonary:     Effort: Pulmonary effort is  normal. No respiratory distress.     Breath sounds: Normal breath sounds. No wheezing, rhonchi or rales.  Abdominal:     Palpations: Abdomen is soft.     Tenderness: There is abdominal tenderness in the right lower quadrant, periumbilical area, suprapubic area and left lower quadrant.  Musculoskeletal:        General: No swelling.     Cervical back: Neck supple.  Skin:    General: Skin is warm and dry.     Capillary Refill: Capillary refill takes less than 2 seconds.  Neurological:     General: No focal deficit present.     Mental Status: He is alert and oriented to person, place, and time.  Psychiatric:        Mood and Affect: Mood normal.     ED Results / Procedures / Treatments   Labs (all labs ordered are listed, but only abnormal results are displayed) Labs Reviewed  COMPREHENSIVE METABOLIC PANEL - Abnormal; Notable for the following components:      Result Value   Creatinine, Ser 1.25 (*)    Total Bilirubin 2.0 (*)    All other components within normal limits  CBC - Abnormal; Notable for the following components:   WBC 18.1 (*)    RBC 6.02 (*)    Hemoglobin 17.8 (*)    HCT 52.7 (*)    All other components within normal limits  URINALYSIS, ROUTINE W REFLEX MICROSCOPIC - Abnormal; Notable for the following components:   Color, Urine AMBER (*)    Ketones, ur 20 (*)    Protein, ur 30 (*)    All other components within normal limits  LIPASE, BLOOD    EKG EKG Interpretation  Date/Time:  Wednesday March 16 2023 17:11:15 EDT Ventricular Rate:  119 PR Interval:  133 QRS Duration: 92 QT Interval:  293 QTC Calculation: 413 R Axis:   -70 Text Interpretation: Sinus tachycardia Left anterior fascicular block ST elev, probable normal early repol pattern Confirmed by Pricilla Loveless [phone]) on 03/16/2023 6:11:51 PM  Radiology CT Renal Stone Study  Result Date: 03/16/2023 CLINICAL DATA:  Lower abdominal pain worse on the right. EXAM: CT ABDOMEN AND PELVIS WITHOUT CONTRAST  TECHNIQUE: Multidetector CT imaging of the abdomen and pelvis was performed following the standard protocol without IV contrast. RADIATION DOSE REDUCTION: This exam was performed according to the departmental dose-optimization program which includes automated exposure control, adjustment of the mA and/or kV according to patient size and/or use of iterative reconstruction technique. COMPARISON:  CT abdomen and pelvis 08/13/2016 FINDINGS: Lower chest: Mild atelectasis or scarring in the lung bases. No pleural effusion. Hepatobiliary: Diffusely decreased attenuation of the liver compatible with steatosis. Unchanged 6 mm hypodense focus in the right  hepatic lobe, too small to fully characterize but with stability compatible with a benign etiology such as a cyst. Unremarkable gallbladder. No biliary dilatation. Pancreas: Unremarkable. Spleen: Unremarkable. Adrenals/Urinary Tract: Unremarkable adrenal glands. Possible 1 mm renal calculi bilaterally. No hydronephrosis or ureteral calculi. Nondistended bladder. Stomach/Bowel: The stomach is unremarkable. There is no evidence of bowel obstruction. There is left-sided colonic diverticulosis, and there are inflammatory changes involving a diverticulum of the sigmoid colon with regional colonic wall thickening. Several tiny foci of extraluminal gas are noted superior to this inflamed diverticulum. The appendix is unremarkable. Vascular/Lymphatic: Normal caliber of the abdominal aorta. No enlarged lymph nodes. Reproductive: Unremarkable prostate. Other: No intraperitoneal free fluid or fluid collection. Small fat-containing umbilical hernia. Musculoskeletal: No acute osseous abnormality or suspicious osseous lesion. IMPRESSION: 1. Acute sigmoid diverticulitis with microperforation. 2. Hepatic steatosis. 3. Possible tiny nonobstructing bilateral renal calculi. Electronically Signed   By: Sebastian Ache M.D.   On: 03/16/2023 16:42    Procedures Procedures    Medications  Ordered in ED Medications  piperacillin-tazobactam (ZOSYN) IVPB 3.375 g (has no administration in time range)  acetaminophen (TYLENOL) tablet 1,000 mg (1,000 mg Oral Given 03/16/23 1635)  lactated ringers bolus 1,000 mL (1,000 mLs Intravenous New Bag/Given 03/16/23 1825)  piperacillin-tazobactam (ZOSYN) IVPB 3.375 g (3.375 g Intravenous New Bag/Given 03/16/23 1758)    ED Course/ Medical Decision Making/ A&P                             Medical Decision Making Amount and/or Complexity of Data Reviewed Labs: ordered.  Risk Prescription drug management.  This patient presents to the ED for concern of lower abdominal pain, this involves an extensive number of treatment options, and is a complaint that carries with it a high risk of complications and morbidity.  The differential diagnosis for generalized abdominal pain includes, but is not limited to AAA, gastroenteritis, appendicitis, Bowel obstruction, Bowel perforation. Gastroparesis, DKA, Hernia, Inflammatory bowel disease, mesenteric ischemia, pancreatitis, peritonitis SBP, volvulus.   Co morbidities that complicate the patient evaluation   history of diverticulitis  My initial workup includes labs, imaging, IV fluids, Zosyn  Additional history obtained from: Nursing notes from this visit. Family wife is present and provides a portion of the history  I ordered, reviewed and interpreted labs which include: CBC, CMP, lipase, urinalysis.  Creatinine of 1.25 with a bilirubin of 2.0.  Leukocytosis of 18.1.  Urinalysis unremarkable  I ordered imaging studies including CT renal stone study I independently visualized and interpreted imaging which showed sigmoid diverticulitis with possible microperforation I agree with the radiologist interpretation  General surgery was previously aware of this patient and his status in the emergency department.  They assessed patient at bedside and determined that he needed admission for continued  monitoring.  Initially febrile to one 1.9, persistently tachycardic.  Otherwise hemodynamically stable.  42 year old male presenting to the ED for evaluation of lower abdominal pain.  Does have a history of diverticulitis.  CT stone study does show diverticulitis today with possible microperforations but no abscess or fluid collections.  Given his leukocytosis, fever, tachycardia patient was started on IV Zosyn.  Surgery was already aware of patient and the department and assessed him at bedside.  They determined admission to the hospital was most appropriate neck step.  Tylenol was given for fever.  IV fluids were also administered.  He is declining pain medicine stating that his pain is not too bad when he  is stationary.  Stable at the time of admission.  Patient's case discussed with Dr. Criss Alvine who agrees with plan to admit.   Note: Portions of this report may have been transcribed using voice recognition software. Every effort was made to ensure accuracy; however, inadvertent computerized transcription errors may still be present.        Final Clinical Impression(s) / ED Diagnoses Final diagnoses:  Diverticulitis    Rx / DC Orders ED Discharge Orders     None         Michelle Piper, Cordelia Poche 03/16/23 Judithann Sauger, MD 03/16/23 2252

## 2023-03-16 NOTE — ED Provider Triage Note (Signed)
Emergency Medicine Provider Triage Evaluation Note  Brian [name] , a 42 y.o. male  was evaluated in triage.  Pt complains of abdominal pain.  Started all of a sudden last night.  It is located all along the lower abdomen.  It is nonradiating.  Endorses dysuria.  Denies hematuria.  Denies fever.  Denies nausea vomiting diarrhea.  Review of Systems  Positive: See above Negative: See above  Physical Exam  BP 114/75 (BP Location: Right Arm)   Pulse (!) 121   Temp (!) 101.9 F (38.8 C)   Resp 18   SpO2 99%  Gen:   Awake, no distress   Resp:  Normal effort  MSK:   Moves extremities without difficulty  Other:    Medical Decision Making  Medically screening exam initiated at 4:08 PM.  Appropriate orders placed.  Melton Alar Putt was informed that the remainder of the evaluation will be completed by another provider, this initial triage assessment does not replace that evaluation, and the importance of remaining in the ED until their evaluation is complete.  Work up started   Gareth Eagle, PA-C 03/16/23 1609

## 2023-03-16 NOTE — Progress Notes (Signed)
Pharmacy Antibiotic Note  Brian [name] is a 42 y.o. male admitted on 03/16/2023 with  intra-abdominal infection .  Pharmacy has been consulted for Zosyn dosing.  Patient presenting from urgent care with lower abdominal pain x1 day. He has a history of diverticulitis from 2017. Patient denies fever, nausea, vomiting, diarrhea, constipation.   Plan: Zosyn 3.375g IV q8h (4 hour infusion). Monitor renal function and overall clinical picture  De-escalate antibiotics when able     Temp (24hrs), Avg:100.9 F (38.3 C), Min:99.8 F (37.7 C), Max:101.9 F (38.8 C)  Recent Labs  Lab 03/16/23 1556  WBC 18.1*  CREATININE 1.25*    CrCl cannot be calculated (Unknown ideal weight.).    Allergies  Allergen Reactions   Bee Venom Anaphylaxis    Antimicrobials this admission: 4/17 Zosyn >>   Microbiology results: N/A   Thank you for allowing pharmacy to be a part of this patient's care.  Cherylin Mylar, PharmD PGY1 Pharmacy Resident 4/17/20246:10 PM

## 2023-03-16 NOTE — ED Notes (Signed)
Patient is being discharged from the Urgent Care and sent to the Emergency Department via prvate vehicle . Per Marlowe Shores PA, patient is in need of higher level of care due to abdominal pain. Patient is aware and verbalizes understanding of plan of care.  Vitals:   03/16/23 1417  BP: 130/72  Pulse: (!) 113  Resp: 16  Temp: 99.8 F (37.7 C)  SpO2: 96%

## 2023-03-17 ENCOUNTER — Other Ambulatory Visit: Payer: Self-pay

## 2023-03-17 ENCOUNTER — Encounter (HOSPITAL_COMMUNITY): Payer: Self-pay

## 2023-03-17 LAB — BASIC METABOLIC PANEL
Anion gap: 7 (ref 5–15)
BUN: 15 mg/dL (ref 6–20)
CO2: 26 mmol/L (ref 22–32)
Calcium: 8.6 mg/dL — ABNORMAL LOW (ref 8.9–10.3)
Chloride: 101 mmol/L (ref 98–111)
Creatinine, Ser: 1.47 mg/dL — ABNORMAL HIGH (ref 0.61–1.24)
GFR, Estimated: >60 mL/min (ref >60)
Glucose, Bld: 93 mg/dL (ref 70–99)
Potassium: 3.6 mmol/L (ref 3.5–5.1)
Sodium: 134 mmol/L — ABNORMAL LOW (ref 135–145)

## 2023-03-17 LAB — CBC WITH DIFFERENTIAL/PLATELET
Abs Immature Granulocytes: 0.07 10*3/uL (ref 0.00–0.07)
Basophils Absolute: 0.1 10*3/uL (ref 0.0–0.1)
Basophils Relative: 0 %
Eosinophils Absolute: 0.1 10*3/uL (ref 0.0–0.5)
Eosinophils Relative: 1 %
HCT: 50.4 % (ref 39.0–52.0)
Hemoglobin: 16.7 g/dL (ref 13.0–17.0)
Immature Granulocytes: 1 %
Lymphocytes Relative: 13 %
Lymphs Abs: 2 10*3/uL (ref 0.7–4.0)
MCH: 29.2 pg (ref 26.0–34.0)
MCHC: 33.1 g/dL (ref 30.0–36.0)
MCV: 88.1 fL (ref 80.0–100.0)
Monocytes Absolute: 1.8 10*3/uL — ABNORMAL HIGH (ref 0.1–1.0)
Monocytes Relative: 12 %
Neutro Abs: 11.5 10*3/uL — ABNORMAL HIGH (ref 1.7–7.7)
Neutrophils Relative %: 73 %
Platelets: 178 10*3/uL (ref 150–400)
RBC: 5.72 MIL/uL (ref 4.22–5.81)
RDW: 13.2 % (ref 11.5–15.5)
WBC: 15.5 10*3/uL — ABNORMAL HIGH (ref 4.0–10.5)
nRBC: 0 % (ref 0.0–0.2)

## 2023-03-17 LAB — HIV ANTIBODY (ROUTINE TESTING W REFLEX): HIV Screen 4th Generation wRfx: NONREACTIVE

## 2023-03-17 MED ORDER — SODIUM CHLORIDE 0.9 % IV BOLUS
1000.0000 mL | Freq: Once | INTRAVENOUS | Status: AC
  Administered 2023-03-17: 1000 mL via INTRAVENOUS

## 2023-03-17 NOTE — Care Management (Signed)
Screening Note The Transition of Care Department (TOC) has reviewed patient and no TOC needs have been identified at this time. We will continue to monitor patient advancement through interdisciplinary progression rounds. If new patient transition needs arise, please place a TOC consult  

## 2023-03-17 NOTE — Progress Notes (Signed)
Progress Note     Subjective: Feeling better than yesterday - still with abdominal pain but improved and developed diarrhea overnight. Drinking clear liquids without worsening symptoms. No n/v. He had been having some pain with initiating urination and dysuria but this has improved.   Objective: Vital signs in last 24 hours: Temp:  [97.6 F (36.4 C)-101.9 F (38.8 C)] 97.6 F (36.4 C) (04/18 0800) Pulse Rate:  [94-124] 94 (04/18 0800) Resp:  [16-22] 16 (04/17 2033) BP: (96-132)/(62-76) 112/76 (04/18 0800) SpO2:  [93 %-99 %] 97 % (04/18 0800) Weight:  [90.7 kg] 90.7 kg (04/18 0317) Last BM Date : 03/16/23  Intake/Output from previous day: 04/17 0701 - 04/18 0700 In: 1000 [IV Piggyback:1000] Out: -  Intake/Output this shift: No intake/output data recorded.  PE: General: pleasant, WD, emale who is laying in bed in NAD Lungs: Respiratory effort nonlabored on room air Abd: soft, ND, mild TTP across lower abdomen without rebound or guarding MSK: all 4 extremities are symmetrical with no cyanosis, clubbing, or edema. Skin: warm and dry with no masses, lesions, or rashes Psych: A&Ox3 with an appropriate affect.    Lab Results:  Recent Labs    03/16/23 1556 03/17/23 0343  WBC 18.1* 15.5*  HGB 17.8* 16.7  HCT 52.7* 50.4  PLT 212 178   BMET Recent Labs    03/16/23 1556 03/17/23 0343  NA 138 134*  K 3.8 3.6  CL 103 101  CO2 22 26  GLUCOSE 81 93  BUN 17 15  CREATININE 1.25* 1.47*  CALCIUM 9.0 8.6*   PT/INR No results for input(s): "LABPROT", "INR" in the last 72 hours. CMP     Component Value Date/Time   NA 134 (L) 03/17/2023 0343   NA 142 04/09/2016 0000   K 3.6 03/17/2023 0343   CL 101 03/17/2023 0343   CO2 26 03/17/2023 0343   GLUCOSE 93 03/17/2023 0343   BUN 15 03/17/2023 0343   BUN 20 04/09/2016 0000   CREATININE 1.47 (H) 03/17/2023 0343   CREATININE 1.22 08/11/2020 1330   CALCIUM 8.6 (L) 03/17/2023 0343   PROT 6.8 03/16/2023 1556   ALBUMIN 4.2  03/16/2023 1556   AST 31 03/16/2023 1556   ALT 38 03/16/2023 1556   ALKPHOS 70 03/16/2023 1556   BILITOT 2.0 (H) 03/16/2023 1556   GFRNONAA >60 03/17/2023 0343   Lipase     Component Value Date/Time   LIPASE 26 03/16/2023 1556       Studies/Results: CT Renal Stone Study  Result Date: 03/16/2023 CLINICAL DATA:  Lower abdominal pain worse on the right. EXAM: CT ABDOMEN AND PELVIS WITHOUT CONTRAST TECHNIQUE: Multidetector CT imaging of the abdomen and pelvis was performed following the standard protocol without IV contrast. RADIATION DOSE REDUCTION: This exam was performed according to the departmental dose-optimization program which includes automated exposure control, adjustment of the mA and/or kV according to patient size and/or use of iterative reconstruction technique. COMPARISON:  CT abdomen and pelvis 08/13/2016 FINDINGS: Lower chest: Mild atelectasis or scarring in the lung bases. No pleural effusion. Hepatobiliary: Diffusely decreased attenuation of the liver compatible with steatosis. Unchanged 6 mm hypodense focus in the right hepatic lobe, too small to fully characterize but with stability compatible with a benign etiology such as a cyst. Unremarkable gallbladder. No biliary dilatation. Pancreas: Unremarkable. Spleen: Unremarkable. Adrenals/Urinary Tract: Unremarkable adrenal glands. Possible 1 mm renal calculi bilaterally. No hydronephrosis or ureteral calculi. Nondistended bladder. Stomach/Bowel: The stomach is unremarkable. There is no evidence of bowel  obstruction. There is left-sided colonic diverticulosis, and there are inflammatory changes involving a diverticulum of the sigmoid colon with regional colonic wall thickening. Several tiny foci of extraluminal gas are noted superior to this inflamed diverticulum. The appendix is unremarkable. Vascular/Lymphatic: Normal caliber of the abdominal aorta. No enlarged lymph nodes. Reproductive: Unremarkable prostate. Other: No  intraperitoneal free fluid or fluid collection. Small fat-containing umbilical hernia. Musculoskeletal: No acute osseous abnormality or suspicious osseous lesion. IMPRESSION: 1. Acute sigmoid diverticulitis with microperforation. 2. Hepatic steatosis. 3. Possible tiny nonobstructing bilateral renal calculi. Electronically Signed   By: Sebastian Ache M.D.   On: 03/16/2023 16:42    Anti-infectives: Anti-infectives (From admission, onward)    Start     Dose/Rate Route Frequency Ordered Stop   03/17/23 0200  piperacillin-tazobactam (ZOSYN) IVPB 3.375 g        3.375 g 12.5 mL/hr over 240 Minutes Intravenous Every 8 hours 03/16/23 1814     03/16/23 1730  piperacillin-tazobactam (ZOSYN) IVPB 3.375 g        3.375 g 100 mL/hr over 30 Minutes Intravenous  Once 03/16/23 1727 03/16/23 1851        Assessment/Plan Acute diverticulitis - microperforation - WBC improved to 15.5, febrile to 101.9 last 24h - continue clears  - AKI with Cr 1.47 (1.25) - continue ivf and trend  FEN: clears, LR /hr ID: zosyn VTE: lovenox  I reviewed last 24 h vitals and pain scores, last 48 h intake and output, last 24 h labs and trends, and last 24 h imaging results.   LOS: 1 day   Eric Form, [facility] 03/17/2023, 8:07 AM Please see Amion for pager number during day hours 7:00am-4:30pm

## 2023-03-18 ENCOUNTER — Encounter (HOSPITAL_COMMUNITY): Payer: Self-pay

## 2023-03-18 LAB — BASIC METABOLIC PANEL
Anion gap: 10 (ref 5–15)
BUN: 11 mg/dL (ref 6–20)
CO2: 23 mmol/L (ref 22–32)
Calcium: 8.5 mg/dL — ABNORMAL LOW (ref 8.9–10.3)
Chloride: 102 mmol/L (ref 98–111)
Creatinine, Ser: 1.44 mg/dL — ABNORMAL HIGH (ref 0.61–1.24)
GFR, Estimated: >60 mL/min (ref >60)
Glucose, Bld: 84 mg/dL (ref 70–99)
Potassium: 3.7 mmol/L (ref 3.5–5.1)
Sodium: 135 mmol/L (ref 135–145)

## 2023-03-18 LAB — CBC
HCT: 45.6 % (ref 39.0–52.0)
Hemoglobin: 15.8 g/dL (ref 13.0–17.0)
MCH: 29.8 pg (ref 26.0–34.0)
MCHC: 34.6 g/dL (ref 30.0–36.0)
MCV: 86 fL (ref 80.0–100.0)
Platelets: 175 10*3/uL (ref 150–400)
RBC: 5.3 MIL/uL (ref 4.22–5.81)
RDW: 13.1 % (ref 11.5–15.5)
WBC: 9.9 10*3/uL (ref 4.0–10.5)
nRBC: 0 % (ref 0.0–0.2)

## 2023-03-18 NOTE — Progress Notes (Signed)
Progress Note     Subjective: Feeling a lot better today.  Tolerating CLD but didn't have much appetite.  Still with diarrhea.  No nausea.  Pain much improved.  bored   Objective: Vital signs in last 24 hours: Temp:  [97.2 F (36.2 C)-98.5 F (36.9 C)] 97.5 F (36.4 C) (04/19 0812) Pulse Rate:  [75-92] 75 (04/19 0812) Resp:  [12-19] 16 (04/19 0812) BP: (116-138)/(70-90) 125/70 (04/19 0812) SpO2:  [92 %-98 %] 98 % (04/19 0812) Last BM Date : 03/18/23  Intake/Output from previous day: No intake/output data recorded. Intake/Output this shift: No intake/output data recorded.  PE: General: pleasant, WD, male who is laying in bed in NAD Lungs: Respiratory effort nonlabored on room air, CTAB Abd: soft, ND, almost NT today Psych: A&Ox3 with an appropriate affect.    Lab Results:  Recent Labs    03/17/23 0343 03/18/23 0329  WBC 15.5* 9.9  HGB 16.7 15.8  HCT 50.4 45.6  PLT 178 175   BMET Recent Labs    03/17/23 0343 03/18/23 0329  NA 134* 135  K 3.6 3.7  CL 101 102  CO2 26 23  GLUCOSE 93 84  BUN 15 11  CREATININE 1.47* 1.44*  CALCIUM 8.6* 8.5*   PT/INR No results for input(s): "LABPROT", "INR" in the last 72 hours. CMP     Component Value Date/Time   NA 135 03/18/2023 0329   NA 142 04/09/2016 0000   K 3.7 03/18/2023 0329   CL 102 03/18/2023 0329   CO2 23 03/18/2023 0329   GLUCOSE 84 03/18/2023 0329   BUN 11 03/18/2023 0329   BUN 20 04/09/2016 0000   CREATININE 1.44 (H) 03/18/2023 0329   CREATININE 1.22 08/11/2020 1330   CALCIUM 8.5 (L) 03/18/2023 0329   PROT 6.8 03/16/2023 1556   ALBUMIN 4.2 03/16/2023 1556   AST 31 03/16/2023 1556   ALT 38 03/16/2023 1556   ALKPHOS 70 03/16/2023 1556   BILITOT 2.0 (H) 03/16/2023 1556   GFRNONAA >60 03/18/2023 0329   Lipase     Component Value Date/Time   LIPASE 26 03/16/2023 1556       Studies/Results: CT Renal Stone Study  Result Date: 03/16/2023 CLINICAL DATA:  Lower abdominal pain worse on the  right. EXAM: CT ABDOMEN AND PELVIS WITHOUT CONTRAST TECHNIQUE: Multidetector CT imaging of the abdomen and pelvis was performed following the standard protocol without IV contrast. RADIATION DOSE REDUCTION: This exam was performed according to the departmental dose-optimization program which includes automated exposure control, adjustment of the mA and/or kV according to patient size and/or use of iterative reconstruction technique. COMPARISON:  CT abdomen and pelvis 08/13/2016 FINDINGS: Lower chest: Mild atelectasis or scarring in the lung bases. No pleural effusion. Hepatobiliary: Diffusely decreased attenuation of the liver compatible with steatosis. Unchanged 6 mm hypodense focus in the right hepatic lobe, too small to fully characterize but with stability compatible with a benign etiology such as a cyst. Unremarkable gallbladder. No biliary dilatation. Pancreas: Unremarkable. Spleen: Unremarkable. Adrenals/Urinary Tract: Unremarkable adrenal glands. Possible 1 mm renal calculi bilaterally. No hydronephrosis or ureteral calculi. Nondistended bladder. Stomach/Bowel: The stomach is unremarkable. There is no evidence of bowel obstruction. There is left-sided colonic diverticulosis, and there are inflammatory changes involving a diverticulum of the sigmoid colon with regional colonic wall thickening. Several tiny foci of extraluminal gas are noted superior to this inflamed diverticulum. The appendix is unremarkable. Vascular/Lymphatic: Normal caliber of the abdominal aorta. No enlarged lymph nodes. Reproductive: Unremarkable prostate. Other: No  intraperitoneal free fluid or fluid collection. Small fat-containing umbilical hernia. Musculoskeletal: No acute osseous abnormality or suspicious osseous lesion. IMPRESSION: 1. Acute sigmoid diverticulitis with microperforation. 2. Hepatic steatosis. 3. Possible tiny nonobstructing bilateral renal calculi. Electronically Signed   By: Sebastian Ache M.D.   On: 03/16/2023 16:42     Anti-infectives: Anti-infectives (From admission, onward)    Start     Dose/Rate Route Frequency Ordered Stop   03/17/23 0200  piperacillin-tazobactam (ZOSYN) IVPB 3.375 g        3.375 g 12.5 mL/hr over 240 Minutes Intravenous Every 8 hours 03/16/23 1814     03/16/23 1730  piperacillin-tazobactam (ZOSYN) IVPB 3.375 g        3.375 g 100 mL/hr over 30 Minutes Intravenous  Once 03/16/23 1727 03/16/23 1851        Assessment/Plan Acute diverticulitis - microperforation - WBC normal and AF -overall much better, will adv to FLD today and soft in am -cont zosyn today, and likely transition to oral meds tomorrow   FEN: FLD, soft in am, LR /hr ID: zosyn VTE: lovenox  AKI - GFR normal.  Cr 1.44 today from 1.47.  increase LR to 125cc and encourage water intake.  Still with diarrhea, likely contributing to some dehydration.  Check BMET in am  I reviewed last 24 h vitals and pain scores, last 48 h intake and output, last 24 h labs and trends, and last 24 h imaging results.   LOS: 2 days   Letha Cape, [facility] Surgery 03/18/2023, 8:29 AM Please see Amion for pager number during day hours 7:00am-4:30pm

## 2023-03-19 LAB — CBC
HCT: 44.2 % (ref 39.0–52.0)
Hemoglobin: 15.5 g/dL (ref 13.0–17.0)
MCH: 29.7 pg (ref 26.0–34.0)
MCHC: 35.1 g/dL (ref 30.0–36.0)
MCV: 84.7 fL (ref 80.0–100.0)
Platelets: 199 10*3/uL (ref 150–400)
RBC: 5.22 MIL/uL (ref 4.22–5.81)
RDW: 13.1 % (ref 11.5–15.5)
WBC: 7.9 10*3/uL (ref 4.0–10.5)
nRBC: 0 % (ref 0.0–0.2)

## 2023-03-19 LAB — BASIC METABOLIC PANEL
Anion gap: 4 — ABNORMAL LOW (ref 5–15)
BUN: 6 mg/dL (ref 6–20)
CO2: 24 mmol/L (ref 22–32)
Calcium: 8.5 mg/dL — ABNORMAL LOW (ref 8.9–10.3)
Chloride: 107 mmol/L (ref 98–111)
Creatinine, Ser: 1.4 mg/dL — ABNORMAL HIGH (ref 0.61–1.24)
GFR, Estimated: >60 mL/min (ref >60)
Glucose, Bld: 87 mg/dL (ref 70–99)
Potassium: 3.9 mmol/L (ref 3.5–5.1)
Sodium: 135 mmol/L (ref 135–145)

## 2023-03-19 MED ORDER — CIPROFLOXACIN HCL 500 MG PO TABS: 500.0000 mg | ORAL_TABLET | Freq: Two times a day (BID) | ORAL | 0 refills | Status: AC

## 2023-03-19 MED ORDER — METRONIDAZOLE 500 MG PO TABS: 500.0000 mg | ORAL_TABLET | Freq: Three times a day (TID) | ORAL | 0 refills | Status: AC

## 2023-03-19 NOTE — Discharge Summary (Signed)
Physician Discharge Summary  Patient ID: Brian [name] MRN: [id number] DOB/AGE: Jul 28, 1981 42 y.o.  Admit date: 03/16/2023 Discharge date: 03/19/2023  Admission Diagnoses: Diverticulitis of sigmoid with microperforation.   Discharge Diagnoses:  Same as above  Discharged Condition: improved  Hospital Course:  Patient was admitted to the floor 03/16/2023.  He rapidly improved with IV antibiotics and bowel rest.  His diet was slowly advanced from clear liquids to low residue diet.  He initially had a lot of loose stools, but these are improving.  WBCs came down from 18 to 7.9.  He feels like pain is nearly completely resolved.  He is dressed and ready to go home.    Of note, he had a colonoscopy last year that showed a single 5 mm polyp and a hemorrhoid.  No evidence of malignancy.    Consults:  none  Significant Diagnostic Studies: labs: see above  Treatments: IV hydration and antibiotics: Zosyn  Discharge Exam: Blood pressure 126/80, pulse 90, temperature 98.5 F (36.9 C), temperature source Oral, resp. rate 19, height  (1.778 m), weight 90.7 kg, SpO2 98 %. General appearance: alert, cooperative, and no distress Resp: breathing comfortably GI: soft,  Extremities: extremities normal, atraumatic, no cyanosis or edema  Disposition: Discharge disposition: 01-Home or Self Care       Discharge Instructions     Call MD for:  difficulty breathing, headache or visual disturbances   Complete by: As directed    Call MD for:  hives   Complete by: As directed    Call MD for:  persistant nausea and vomiting   Complete by: As directed    Call MD for:  redness, tenderness, or signs of infection (pain, swelling, redness, odor or green/yellow discharge around incision site)   Complete by: As directed    Call MD for:  severe uncontrolled pain   Complete by: As directed    Call MD for:  temperature >100.4   Complete by: As directed    Diet - low sodium heart healthy   Complete  by: As directed    Increase activity slowly   Complete by: As directed       Allergies as of 03/19/2023       Reactions   Bee Venom Anaphylaxis        Medication List     STOP taking these medications    amoxicillin 500 MG capsule Commonly known as: AMOXIL   cyclobenzaprine 10 MG tablet Commonly known as: FLEXERIL   diphenhydrAMINE 25 MG tablet Commonly known as: BENADRYL   doxycycline 100 MG tablet Commonly known as: VIBRA-TABS   oxyCODONE-acetaminophen 5-325 MG tablet Commonly known as: Percocet       TAKE these medications    ciprofloxacin 500 MG tablet Commonly known as: Cipro Take 1 tablet (500 mg total) by mouth 2 (two) times daily for 14 days.   famotidine 20 MG tablet Commonly known as: PEPCID Take 1 tablet (20 mg total) by mouth 2 (two) times daily.   metroNIDAZOLE 500 MG tablet Commonly known as: Flagyl Take 1 tablet (500 mg total) by mouth 3 (three) times daily for 14 days.   naproxen 500 MG tablet Commonly known as: Naprosyn Take 1 tablet (500 mg total) by mouth 2 (two) times daily.   ofloxacin 0.3 % ophthalmic solution Commonly known as: OCUFLOX Place 1 drop into the left eye 4 (four) times daily. For 7 days   ondansetron 4 MG tablet Commonly known as: Zofran Take 1 tablet (4  mg total) by mouth every 6 (six) to 8 (eight) hours as needed for post op nausea.   predniSONE 50 MG tablet Commonly known as: DELTASONE Take 1 tablet (50 mg total) by mouth daily.   sodium chloride HYPERTONIC 3 % nebulizer solution Take by nebulization as needed for other.   testosterone cypionate 100 MG/ML injection Commonly known as: DEPOTESTOTERONE CYPIONATE Inject 1 mL (100 mg total) into the muscle once a week. What changed: when to take this   venlafaxine XR 150 MG 24 hr capsule Commonly known as: EFFEXOR-XR TAKE 1 CAPSULE BY MOUTH DAILY WITH BREAKFAST. What changed:  how much to take when to take this        Follow-up Information      Copland, Gwenlyn Found, MD Follow up.   Specialty: Family Medicine Why: As needed Contact information: [address] [place] [address] [phone]         [facility] [place] Follow up.   Specialty: General Surgery Why: As needed Contact information: [address] [place] [address] [phone]                 Signed: Almond Lint 03/19/2023, 8:24 AM

## 2023-03-19 NOTE — Discharge Instructions (Signed)
Follow low fiber diet for 2 weeks.    Then may transition to diet as tolerated. Continue antibiotics for 2 weeks.  Call for appointment if pain recurs.    However if pain is severe or if you have fever/chills/nausea/vomiting, may need to return to ED.

## 2023-03-21 ENCOUNTER — Telehealth: Payer: Self-pay

## 2023-03-21 NOTE — Transitions of Care (Post Inpatient/ED Visit) (Signed)
   03/21/2023  Name: Brian [name] MRN: [id number] DOB: 1981/09/30  Today's TOC FU Call Status: Today's TOC FU Call Status:: Successful TOC FU Call Competed TOC FU Call Complete Date: 03/21/23  Transition Care Management Follow-up Telephone Call Date of Discharge: 03/20/23 Discharge Facility: [facility]) Type of Discharge: Inpatient Admission Primary Inpatient Discharge Diagnosis:: diverticulits How have you been since you were released from the hospital?: Better Any questions or concerns?: No  Items Reviewed: Did you receive and understand the discharge instructions provided?: Yes Medications obtained and verified?: Yes (Medications Reviewed) Any new allergies since your discharge?: No Dietary orders reviewed?: Yes Do you have support at home?: Yes People in Home: spouse  Home Care and Equipment/Supplies: Were Home Health Services Ordered?: NA Any new equipment or medical supplies ordered?: NA  Functional Questionnaire: Do you need assistance with bathing/showering or dressing?: No Do you need assistance with meal preparation?: No Do you need assistance with eating?: No Do you have difficulty maintaining continence: No Do you need assistance with getting out of bed/getting out of a chair/moving?: No Do you have difficulty managing or taking your medications?: No  Follow up appointments reviewed: PCP Follow-up appointment confirmed?: Yes Date of PCP follow-up appointment?: 03/30/23 Follow-up Provider: [facility] Follow-up appointment confirmed?: NA Do you need transportation to your follow-up appointment?: No Do you understand care options if your condition(s) worsen?: Yes-patient verbalized understanding    SIGNATURE Karena Addison, LPN [facility] Nurse Health Advisor Direct Dial [phone]

## 2023-03-27 NOTE — Progress Notes (Unsigned)
[facility] [address] [name], [place] [address] [phone]  Date:  03/30/2023   Name:  Brian [name]   DOB:  01-28-1981   MRN:  [id number]  PCP:  Pearline Cables, MD    Chief Complaint: No chief complaint on file.   History of Present Illness:  DRADEN [name] is a 42 y.o. very pleasant male patient who presents with the following:  Patient seen today for hospital admission follow-up Most recently seen by myself last summer-history of hypogonadism He was admitted to the hospital last month, April with diverticulitis or perforation  Admit date: 03/16/2023 Discharge date: 03/19/2023 Admission Diagnoses: Diverticulitis of sigmoid with microperforation.   Hospital Course:  Patient was admitted to the floor 03/16/2023.  He rapidly improved with IV antibiotics and bowel rest.  His diet was slowly advanced from clear liquids to low residue diet.  He initially had a lot of loose stools, but these are improving.  WBCs came down from 18 to 7.9.  He feels like pain is nearly completely resolved.  He is dressed and ready to go home.   Of note, he had a colonoscopy last year that showed a single 5 mm polyp and a hemorrhoid.  No evidence of malignancy.    Most recent testosterone level checked October of last year- could use to have this checked again   Patient Active Problem List   Diagnosis Date Noted   Diverticulitis 03/16/2023   Diverticulitis of colon 10/28/2016   Injury of right hand 04/19/2012    Past Medical History:  Diagnosis Date   Diverticulitis 2017    No past surgical history on file.  Social History   Tobacco Use   Smoking status: Never   Smokeless tobacco: Never  Vaping Use   Vaping Use: Never used  Substance Use Topics   Alcohol use: Yes    Alcohol/week: 8.0 standard drinks of alcohol    Types: 8 Cans of beer per week   Drug use: No    Family History  Problem Relation Age of Onset    Heart attack Father    Diabetes Neg Hx    Hyperlipidemia Neg Hx    Hypertension Neg Hx    Sudden death Neg Hx    Colon cancer Neg Hx    Stomach cancer Neg Hx    Pancreatic cancer Neg Hx    Esophageal cancer Neg Hx    Liver cancer Neg Hx     Allergies  Allergen Reactions   Bee Venom Anaphylaxis    Medication list has been reviewed and updated.  Current Outpatient Medications on File Prior to Visit  Medication Sig Dispense Refill   ciprofloxacin (CIPRO) 500 MG tablet Take 1 tablet (500 mg total) by mouth 2 (two) times daily for 14 days. 28 tablet 0   famotidine (PEPCID) 20 MG tablet Take 1 tablet (20 mg total) by mouth 2 (two) times daily. (Patient not taking: Reported on 10/07/2022) 10 tablet 0   metroNIDAZOLE (FLAGYL) 500 MG tablet Take 1 tablet (500 mg total) by mouth 3 (three) times daily for 14 days. 42 tablet 0   naproxen (NAPROSYN) 500 MG tablet Take 1 tablet (500 mg total) by mouth 2 (two) times daily. (Patient not taking: Reported on 10/07/2022) 60 tablet 1   ofloxacin (OCUFLOX) 0.3 % ophthalmic solution Place 1 drop into the left eye 4 (four) times daily. For 7 days (Patient not taking: Reported  on 03/16/2023) 5 mL 0   ondansetron (ZOFRAN) 4 MG tablet Take 1 tablet (4 mg total) by mouth every 6 (six) to 8 (eight) hours as needed for post op nausea. (Patient not taking: Reported on 10/07/2022) 10 tablet 0   predniSONE (DELTASONE) 50 MG tablet Take 1 tablet (50 mg total) by mouth daily. 5 tablet 0   sodium chloride HYPERTONIC 3 % nebulizer solution Take by nebulization as needed for other. (Patient not taking: Reported on 03/16/2023) 750 mL 12   testosterone cypionate (DEPOTESTOTERONE CYPIONATE) 100 MG/ML injection Inject 1 mL (100 mg total) into the muscle once a week. (Patient taking differently: Inject 100 mg into the muscle every 14 (fourteen) days.) 10 mL 1   venlafaxine XR (EFFEXOR-XR) 150 MG 24 hr capsule TAKE 1 CAPSULE BY MOUTH DAILY WITH BREAKFAST. (Patient taking differently:  Take 150 mg by mouth at bedtime.) 90 capsule 4   No current facility-administered medications on file prior to visit.    Review of Systems:  As per HPI- otherwise negative.   Physical Examination: There were no vitals filed for this visit. There were no vitals filed for this visit. There is no height or weight on file to calculate BMI. Ideal Body Weight:    GEN: no acute distress. HEENT: Atraumatic, Normocephalic.  Ears and Nose: No external deformity. CV: RRR, No M/G/R. No JVD. No thrill. No extra heart sounds. PULM: CTA B, no wheezes, crackles, rhonchi. No retractions. No resp. distress. No accessory muscle use. ABD: S, NT, ND, +BS. No rebound. No HSM. EXTR: No c/c/e PSYCH: Normally interactive. Conversant.    Assessment and Plan: ***  Signed Abbe Amsterdam, MD

## 2023-03-29 ENCOUNTER — Encounter: Payer: Self-pay | Admitting: Family Medicine

## 2023-03-29 ENCOUNTER — Other Ambulatory Visit (HOSPITAL_BASED_OUTPATIENT_CLINIC_OR_DEPARTMENT_OTHER): Payer: Self-pay

## 2023-03-29 ENCOUNTER — Other Ambulatory Visit: Payer: Self-pay | Admitting: Family Medicine

## 2023-03-29 DIAGNOSIS — E291 Testicular hypofunction: Secondary | ICD-10-CM

## 2023-03-29 MED ORDER — TESTOSTERONE CYPIONATE 200 MG/ML IM SOLN
100.0000 mg | INTRAMUSCULAR | 0 refills | Status: DC
Start: 2023-03-29 — End: 2023-10-19
  Filled 2023-03-29: qty 2, 28d supply, fill #0
  Filled 2023-05-04: qty 2, 28d supply, fill #1
  Filled 2023-05-25 – 2023-06-20 (×4): qty 2, 28d supply, fill #2
  Filled 2023-09-08: qty 2, 28d supply, fill #3
  Filled 2023-09-12: qty 2, 14d supply, fill #3

## 2023-03-30 ENCOUNTER — Encounter: Payer: Self-pay | Admitting: Family Medicine

## 2023-03-30 ENCOUNTER — Ambulatory Visit: Payer: 59 | Admitting: Family Medicine

## 2023-03-30 VITALS — BP 118/80 | HR 76 | Temp 98.5°F | Resp 18 | Ht 70.0 in | Wt 200.0 lb

## 2023-03-30 DIAGNOSIS — R300 Dysuria: Secondary | ICD-10-CM

## 2023-03-30 DIAGNOSIS — K5792 Diverticulitis of intestine, part unspecified, without perforation or abscess without bleeding: Secondary | ICD-10-CM | POA: Diagnosis not present

## 2023-03-30 DIAGNOSIS — E291 Testicular hypofunction: Secondary | ICD-10-CM | POA: Diagnosis not present

## 2023-03-30 LAB — COMPREHENSIVE METABOLIC PANEL
ALT: 36 U/L (ref 0–53)
AST: 27 U/L (ref 0–37)
Albumin: 4.3 g/dL (ref 3.5–5.2)
Alkaline Phosphatase: 63 U/L (ref 39–117)
BUN: 18 mg/dL (ref 6–23)
CO2: 27 mEq/L (ref 19–32)
Calcium: 9.1 mg/dL (ref 8.4–10.5)
Chloride: 102 mEq/L (ref 96–112)
Creatinine, Ser: 1.18 mg/dL (ref 0.40–1.50)
GFR: 75.7 mL/min (ref >60.00)
Glucose, Bld: 94 mg/dL (ref 70–99)
Potassium: 4 mEq/L (ref 3.5–5.1)
Sodium: 137 mEq/L (ref 135–145)
Total Bilirubin: 0.7 mg/dL (ref 0.2–1.2)
Total Protein: 6.2 g/dL (ref 6.0–8.3)

## 2023-03-30 LAB — CBC
HCT: 49.1 % (ref 39.0–52.0)
Hemoglobin: 16.9 g/dL (ref 13.0–17.0)
MCHC: 34.4 g/dL (ref 30.0–36.0)
MCV: 86.5 fl (ref 78.0–100.0)
Platelets: 292 10*3/uL (ref 150.0–400.0)
RBC: 5.68 Mil/uL (ref 4.22–5.81)
RDW: 13.3 % (ref 11.5–15.5)
WBC: 7 10*3/uL (ref 4.0–10.5)

## 2023-03-30 NOTE — Patient Instructions (Signed)
It was good to see you again today- I am sorry you got so sick!  I will be in touch with your labs asap  If you have return of severe pain go back to the ER Otherwise we will see what your white cell count looks like and decide if we want to repeat your CT scan

## 2023-03-31 ENCOUNTER — Encounter: Payer: Self-pay | Admitting: Family Medicine

## 2023-03-31 LAB — TESTOSTERONE TOTAL,FREE,BIO, MALES
Albumin: 4.2 g/dL (ref 3.6–5.1)
Sex Hormone Binding: 18 nmol/L (ref 10–50)
Testosterone: 179 ng/dL — ABNORMAL LOW (ref 250–827)

## 2023-03-31 LAB — URINE CULTURE
MICRO NUMBER:: 14898817
Result:: NO GROWTH
SPECIMEN QUALITY:: ADEQUATE

## 2023-05-04 ENCOUNTER — Other Ambulatory Visit (HOSPITAL_BASED_OUTPATIENT_CLINIC_OR_DEPARTMENT_OTHER): Payer: Self-pay

## 2023-05-05 ENCOUNTER — Encounter (INDEPENDENT_AMBULATORY_CARE_PROVIDER_SITE_OTHER): Payer: 59 | Admitting: Family Medicine

## 2023-05-05 DIAGNOSIS — L700 Acne vulgaris: Secondary | ICD-10-CM

## 2023-05-05 MED ORDER — TRETINOIN 0.05 % EX CREA
TOPICAL_CREAM | Freq: Every day | CUTANEOUS | 0 refills | Status: DC
Start: 2023-05-05 — End: 2023-05-11

## 2023-05-05 NOTE — Addendum Note (Signed)
Addended by: Abbe Amsterdam C on: 05/05/2023 05:10 PM   Modules accepted: Orders

## 2023-05-05 NOTE — Telephone Encounter (Signed)
Please see the MyChart message reply(ies) for my assessment and plan.  The patient gave consent for this Medical Advice Message and is aware that it may result in a bill to their insurance company as well as the possibility that this may result in a co-payment or deductible. They are an established patient, but are not seeking medical advice exclusively about a problem treated during an in person or video visit in the last 7 days. I did not recommend an in person or video visit within 7 days of my reply.  I spent a total of 10 minutes cumulative time within 7 days through MyChart messaging Lashawna Poche, MD  

## 2023-05-11 ENCOUNTER — Encounter: Payer: Self-pay | Admitting: Family Medicine

## 2023-05-11 ENCOUNTER — Ambulatory Visit (INDEPENDENT_AMBULATORY_CARE_PROVIDER_SITE_OTHER): Payer: 59 | Admitting: Family Medicine

## 2023-05-11 VITALS — BP 125/74 | HR 73 | Ht 70.0 in | Wt 214.0 lb

## 2023-05-11 DIAGNOSIS — H6690 Otitis media, unspecified, unspecified ear: Secondary | ICD-10-CM

## 2023-05-11 MED ORDER — AMOXICILLIN-POT CLAVULANATE 875-125 MG PO TABS
1.0000 | ORAL_TABLET | Freq: Two times a day (BID) | ORAL | 0 refills | Status: DC
Start: 2023-05-11 — End: 2023-09-20

## 2023-05-11 NOTE — Progress Notes (Signed)
Acute Office Visit  Subjective:     Patient ID: Brian [name], male    DOB: [birth year], 42 y.o.   MRN: [id number]  Chief Complaint  Patient presents with   Ear Pain    HPI Patient is in today for right ear pain.   Discussed the use of AI scribe software for clinical note transcription with the patient, who gave verbal consent to proceed.  History of Present Illness   The patient presents with right ear pain that started over a month ago. They initially attributed the discomfort to sinus drainage and began taking over-the-counter allergy medication, which did not alleviate the symptoms. The pain, described as a deep ache, has been severe enough to warrant the use of ibuprofen. The patient denies any changes in hearing, drainage from the ear, or associated symptoms such as sore throat, coughing, sneezing, or runny nose. They also deny fever. The patient has a history of ear infections in childhood. They report tenderness under the right ear and swelling in the lymph nodes. The patient uses earbuds frequently.          ROS All review of systems negative except what is listed in the HPI      Objective:    BP 125/74   Pulse 73   Ht 5\' 10"  (1.778 m)   Wt 214 lb (97.1 kg)   SpO2 97%   BMI 30.71 kg/m    Physical Exam Vitals reviewed.  Constitutional:      Appearance: Normal appearance.  HENT:     Right Ear: Tympanic membrane is erythematous and bulging.     Left Ear: Tympanic membrane normal.  Cardiovascular:     Rate and Rhythm: Normal rate and regular rhythm.     Pulses: Normal pulses.     Heart sounds: Normal heart sounds.  Pulmonary:     Effort: Pulmonary effort is normal.     Breath sounds: Normal breath sounds.  Musculoskeletal:     Cervical back: Normal range of motion.  Lymphadenopathy:     Cervical: No cervical adenopathy.  Skin:    General: Skin is warm and dry.  Neurological:     Mental Status: He is alert and oriented to person, place, and time.   Psychiatric:        Mood and Affect: Mood normal.        Behavior: Behavior normal.        Thought Content: Thought content normal.        Judgment: Judgment normal.        No results found for any visits on 05/11/23.      Assessment & Plan:   Problem List Items Addressed This Visit   None Visit Diagnoses     Acute otitis media, unspecified otitis media type    -  Primary Start ABX. Discussed that lingering effusion could last for several weeks after infection is resolved. Recommended daily Flonase, but patient reports he cannot tolerate nasal sprays due to gag reflex. Patient aware of signs/symptoms requiring further/urgent evaluation.     Relevant Medications   amoxicillin-clavulanate (AUGMENTIN) 875-125 MG tablet       Meds ordered this encounter  Medications   amoxicillin-clavulanate (AUGMENTIN) 875-125 MG tablet    Sig: Take 1 tablet by mouth 2 (two) times daily.    Dispense:  20 tablet    Refill:  0    Order Specific Question:   Supervising Provider    Answer:   Danise Edge A [[id number]]  Return if symptoms worsen or fail to improve.  Clayborne Dana, NP

## 2023-05-16 ENCOUNTER — Other Ambulatory Visit (HOSPITAL_BASED_OUTPATIENT_CLINIC_OR_DEPARTMENT_OTHER): Payer: Self-pay

## 2023-05-26 ENCOUNTER — Other Ambulatory Visit (HOSPITAL_BASED_OUTPATIENT_CLINIC_OR_DEPARTMENT_OTHER): Payer: Self-pay

## 2023-05-30 ENCOUNTER — Other Ambulatory Visit: Payer: Self-pay

## 2023-05-31 ENCOUNTER — Emergency Department (HOSPITAL_COMMUNITY)
Admission: EM | Admit: 2023-05-31 | Discharge: 2023-06-01 | Disposition: A | Discharge status: Home / Self Care | Payer: 59 | Attending: Emergency Medicine | Admitting: Emergency Medicine

## 2023-05-31 ENCOUNTER — Encounter (HOSPITAL_COMMUNITY): Payer: Self-pay

## 2023-05-31 ENCOUNTER — Other Ambulatory Visit: Payer: Self-pay

## 2023-05-31 DIAGNOSIS — R0602 Shortness of breath: Secondary | ICD-10-CM | POA: Diagnosis not present

## 2023-05-31 DIAGNOSIS — L509 Urticaria, unspecified: Secondary | ICD-10-CM | POA: Diagnosis not present

## 2023-05-31 DIAGNOSIS — T782XXA Anaphylactic shock, unspecified, initial encounter: Secondary | ICD-10-CM | POA: Diagnosis present

## 2023-05-31 MED ORDER — DIPHENHYDRAMINE HCL 50 MG/ML IJ SOLN
50.0000 mg | Freq: Once | INTRAMUSCULAR | Status: AC
  Administered 2023-05-31: 50 mg via INTRAVENOUS

## 2023-05-31 MED ORDER — ONDANSETRON HCL 4 MG/2ML IJ SOLN
4.0000 mg | Freq: Once | INTRAMUSCULAR | Status: AC
  Administered 2023-05-31: 4 mg via INTRAVENOUS

## 2023-05-31 MED ORDER — LORAZEPAM 2 MG/ML IJ SOLN
0.5000 mg | Freq: Once | INTRAMUSCULAR | Status: AC
  Administered 2023-05-31: 0.5 mg via INTRAVENOUS
  Filled 2023-05-31: qty 1

## 2023-05-31 MED ORDER — ONDANSETRON HCL 4 MG/2ML IJ SOLN
INTRAMUSCULAR | Status: AC
  Filled 2023-05-31: qty 2

## 2023-05-31 MED ORDER — FAMOTIDINE IN NACL 20-0.9 MG/50ML-% IV SOLN
20.0000 mg | Freq: Once | INTRAVENOUS | Status: AC
  Administered 2023-05-31: 20 mg via INTRAVENOUS

## 2023-05-31 NOTE — ED Triage Notes (Signed)
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 He was bitten by red ants ~2254 tonight.  EMS report angioedema like swelling in tongue and lips. Pt was shob, and had generalized hives.   VS: 92/58bp  98hr  95% on 10L NRB  Meds pta: 0.5mf Epi IM 4mg  zofran  125mg  solumedrol  500cc NS

## 2023-05-31 NOTE — ED Provider Notes (Signed)
[facility] DEPARTMENT AT [facility] Provider Note   CSN: [id number] Arrival date & time: 05/31/23  2309     History {Add pertinent medical, surgical, social history, OB history to HPI:1} Chief Complaint  Patient presents with   Allergic Reaction    Brian [name] is a 42 y.o. male.  HPI     This is a 42 year old male who presents by EMS with anaphylaxis.  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  EMS reports angioedema with swelling of the tongue and lips.  Also reported generalized hives and shortness of breath.  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  Wife reports that his last anaphylaxis required an epinephrine drip.  Per EMS he was hypotensive with a blood pressure of 92/58.  He was given 0.3 mg epi IM x 2, 4 mg Zofran, 125 Solu-Medrol, 500 cc of fluid.  5 caveat for acuity of condition  Home Medications Prior to Admission medications   Not on File      Allergies    Patient has no known allergies.    Review of Systems   Review of Systems  Unable to perform ROS: Acuity of condition    Physical Exam Updated Vital Signs BP (!) 107/59   Pulse 97   Temp 98 F (36.7 C) (Temporal)   Resp (!) 22   Ht 1.803 m (5\' 11" )   Wt 93 kg   SpO2 98%   BMI 28.59 kg/m  Physical Exam Vitals and nursing note reviewed.  Constitutional:      Appearance: He is well-developed. He is diaphoretic.     Comments: Ill-appearing, shaking  HENT:     Head: Normocephalic and atraumatic.     Mouth/Throat:     Mouth: Mucous membranes are moist.     Comments: No angioedema noted Eyes:     Pupils: Pupils are equal, round, and reactive to light.  Cardiovascular:     Rate and Rhythm: Normal rate and regular rhythm.     Heart sounds: Normal heart sounds. No murmur heard. Pulmonary:     Effort: Pulmonary effort is normal. No respiratory distress.     Breath sounds: Normal breath sounds. No wheezing.      Comments: No wheezing Abdominal:     General: Bowel sounds are normal.     Palpations: Abdomen is soft.     Tenderness: There is no abdominal tenderness.     Comments: Retching  Musculoskeletal:     Cervical back: Neck supple.     Comments: Diffuse hyperemia  Lymphadenopathy:     Cervical: No cervical adenopathy.  Skin:    General: Skin is warm.     Comments: Diffuse hyperemia  Neurological:     Mental Status: He is alert and oriented to person, place, and time.  Psychiatric:        Mood and Affect: Mood normal.     ED Results / Procedures / Treatments   Labs (all labs ordered are listed, but only abnormal results are displayed) Labs Reviewed  CBC  BASIC METABOLIC PANEL    EKG None  Radiology No results found.  Procedures Procedures  {Document cardiac monitor, telemetry assessment procedure when appropriate:1}  Medications Ordered in ED Medications  ondansetron (ZOFRAN) 4 MG/2ML injection (0 mg  Hold 05/31/23 2322)  ondansetron (ZOFRAN) injection 4 mg (4 mg Intravenous Given 05/31/23 2318)  diphenhydrAMINE (BENADRYL) injection 50 mg (50 mg Intravenous Given  05/31/23 2318)  famotidine (PEPCID) IVPB 20 mg premix (20 mg Intravenous New Bag/Given 05/31/23 2319)  LORazepam (ATIVAN) injection 0.5 mg (0.5 mg Intravenous Given 05/31/23 2320)    ED Course/ Medical Decision Making/ A&P   {   Click here for ABCD2, HEART and other calculatorsREFRESH Note before signing :1}                          Medical Decision Making Amount and/or Complexity of Data Reviewed Labs: ordered.  Risk Prescription drug management.   ***  {Document critical care time when appropriate:1} {Document review of labs and clinical decision tools ie heart score, Chads2Vasc2 etc:1}  {Document your independent review of radiology images, and any outside records:1} {Document your discussion with family members, caretakers, and with consultants:1} {Document social determinants of health affecting pt's  care:1} {Document your decision making why or why not admission, treatments were needed:1} Final Clinical Impression(s) / ED Diagnoses Final diagnoses:  None    Rx / DC Orders ED Discharge Orders     None

## 2023-06-01 ENCOUNTER — Encounter: Payer: Self-pay | Admitting: Family Medicine

## 2023-06-01 LAB — BASIC METABOLIC PANEL
Anion gap: 16 — ABNORMAL HIGH (ref 5–15)
BUN: 21 mg/dL — ABNORMAL HIGH (ref 6–20)
CO2: 21 mmol/L — ABNORMAL LOW (ref 22–32)
Calcium: 8.9 mg/dL (ref 8.9–10.3)
Chloride: 103 mmol/L (ref 98–111)
Creatinine, Ser: 1.44 mg/dL — ABNORMAL HIGH (ref 0.61–1.24)
GFR, Estimated: >60 mL/min (ref >60)
Glucose, Bld: 198 mg/dL — ABNORMAL HIGH (ref 70–99)
Potassium: 3.8 mmol/L (ref 3.5–5.1)
Sodium: 140 mmol/L (ref 135–145)

## 2023-06-01 LAB — CBC
HCT: 51.1 % (ref 39.0–52.0)
Hemoglobin: 17.7 g/dL — ABNORMAL HIGH (ref 13.0–17.0)
MCH: 30.4 pg (ref 26.0–34.0)
MCHC: 34.6 g/dL (ref 30.0–36.0)
MCV: 87.8 fL (ref 80.0–100.0)
Platelets: 280 10*3/uL (ref 150–400)
RBC: 5.82 MIL/uL — ABNORMAL HIGH (ref 4.22–5.81)
RDW: 12.8 % (ref 11.5–15.5)
WBC: 12.5 10*3/uL — ABNORMAL HIGH (ref 4.0–10.5)
nRBC: 0 % (ref 0.0–0.2)

## 2023-06-01 MED ORDER — EPINEPHRINE 0.3 MG/0.3ML IJ SOAJ: 0.3000 mg | INTRAMUSCULAR | 0 refills | Status: DC | PRN

## 2023-06-01 MED ORDER — PREDNISONE 20 MG PO TABS: 40.0000 mg | ORAL_TABLET | Freq: Every day | ORAL | 0 refills | Status: DC

## 2023-06-01 MED ORDER — SODIUM CHLORIDE 0.9 % IV BOLUS
1000.0000 mL | Freq: Once | INTRAVENOUS | Status: AC
  Administered 2023-06-01: 1000 mL via INTRAVENOUS

## 2023-06-01 NOTE — ED Notes (Signed)
Pt is off of oxygen and ambulated to the bathroom. York Spaniel he feels tired but ok.  No complaints at this time. Oxygen stayed at 94%, hr at 91

## 2023-06-01 NOTE — ED Notes (Signed)
Pt care taken, resting, no complaints at this time 

## 2023-06-01 NOTE — Discharge Instructions (Signed)
Continue prednisone at home.  Use Benadryl and Pepcid as needed.  Make sure that you are taking your EpiPen with you and carry it with you at all times.

## 2023-06-13 ENCOUNTER — Other Ambulatory Visit (HOSPITAL_BASED_OUTPATIENT_CLINIC_OR_DEPARTMENT_OTHER): Payer: Self-pay

## 2023-06-13 ENCOUNTER — Other Ambulatory Visit: Payer: Self-pay

## 2023-06-20 ENCOUNTER — Other Ambulatory Visit: Payer: Self-pay

## 2023-06-23 ENCOUNTER — Other Ambulatory Visit (HOSPITAL_BASED_OUTPATIENT_CLINIC_OR_DEPARTMENT_OTHER): Payer: Self-pay

## 2023-07-06 ENCOUNTER — Encounter: Payer: Self-pay | Admitting: Family Medicine

## 2023-07-06 DIAGNOSIS — R0683 Snoring: Secondary | ICD-10-CM

## 2023-07-20 NOTE — Telephone Encounter (Signed)
Is this one of the referrals where the pt has to reach out d/t lack of referral que?

## 2023-08-08 ENCOUNTER — Encounter: Payer: Self-pay | Admitting: Family Medicine

## 2023-08-08 ENCOUNTER — Ambulatory Visit (HOSPITAL_BASED_OUTPATIENT_CLINIC_OR_DEPARTMENT_OTHER)
Admission: RE | Admit: 2023-08-08 | Discharge: 2023-08-08 | Disposition: A | Discharge status: Home / Self Care | Payer: 59 | Source: Ambulatory Visit | Attending: Family Medicine | Admitting: Family Medicine

## 2023-08-08 DIAGNOSIS — K5792 Diverticulitis of intestine, part unspecified, without perforation or abscess without bleeding: Secondary | ICD-10-CM | POA: Insufficient documentation

## 2023-08-08 MED ORDER — IOHEXOL 300 MG/ML  SOLN
100.0000 mL | Freq: Once | INTRAMUSCULAR | Status: AC | PRN
  Administered 2023-08-08: 100 mL via INTRAVENOUS

## 2023-08-08 NOTE — Telephone Encounter (Signed)
Called [facility] imaging and we were able to get CT on for tonight- called him, he will need to come in right away to drink contrast  Called pt and let him know, he will be in at around 5:30 to start drinking contrast

## 2023-08-09 ENCOUNTER — Encounter: Payer: Self-pay | Admitting: Family Medicine

## 2023-08-09 DIAGNOSIS — K5792 Diverticulitis of intestine, part unspecified, without perforation or abscess without bleeding: Secondary | ICD-10-CM

## 2023-08-30 NOTE — Addendum Note (Signed)
Addended by: Abbe Amsterdam C on: 08/30/2023 05:26 PM   Modules accepted: Orders

## 2023-09-06 ENCOUNTER — Encounter: Payer: Self-pay | Admitting: Primary Care

## 2023-09-08 ENCOUNTER — Other Ambulatory Visit (HOSPITAL_BASED_OUTPATIENT_CLINIC_OR_DEPARTMENT_OTHER): Payer: Self-pay

## 2023-09-12 ENCOUNTER — Other Ambulatory Visit: Payer: Self-pay

## 2023-09-20 ENCOUNTER — Encounter: Payer: Self-pay | Admitting: Primary Care

## 2023-09-20 ENCOUNTER — Ambulatory Visit (INDEPENDENT_AMBULATORY_CARE_PROVIDER_SITE_OTHER): Payer: 59 | Admitting: Primary Care

## 2023-09-20 ENCOUNTER — Institutional Professional Consult (permissible substitution): Payer: 59 | Admitting: Primary Care

## 2023-09-20 VITALS — BP 130/80 | HR 99 | Ht 70.0 in | Wt 221.6 lb

## 2023-09-20 DIAGNOSIS — R0683 Snoring: Secondary | ICD-10-CM | POA: Diagnosis not present

## 2023-09-20 NOTE — Progress Notes (Signed)
@Patient  ID: Brian [name], male    DOB: 1981/01/01, 42 y.o.   MRN: [id number]  Chief Complaint  Patient presents with   Consult    Referring provider: Copland, Gwenlyn Found, MD  HPI: 42 year old male, never smoked. PMH significant for diverticulitis, bilateral shoulder pain.  09/20/2023 Patient presents today for sleep consult.  Patient has symptoms of loud snoring.  He was told that he stopped breathing while asleep by anesthesiology during shoulder surgery and colonoscopy. There was concerned he may have sleep apnea. His sleep is described as being non-restorative.  Typical bedtime is between 10-11 PM.  It takes him on average 10 to 20 minutes to fall asleep.  He wakes up twice throughout the night.  He will occasionally sleep-walk. He starts his day at 6 AM.  No previous sleep studies.  He does operate heavy machinery.  No recent weight gain.  No concern for narcolepsy or cataplexy.  Sleep questionnaire Symptoms-   heavy snoring, nonrestorative sleep, witnessed apnea Prior sleep study-none Bedtime-10 to 11 PM Time to fall asleep-10 to 20 minutes Nocturnal awakenings-twice Out of bed/start of day-6 AM Weight changes-none Do you operate heavy machinery-yes Do you currently wear CPAP-no Do you current wear oxygen-no Epworth- 9  Social history Patient lives with his wife and 2 kids.  He works for the city of [place].  No recent travel.  He drinks 3-4 alcoholic drinks per night.    Allergies  Allergen Reactions   Bee Venom Anaphylaxis   Other Anaphylaxis    Red (fire) ants    Immunization History  Administered Date(s) Administered   Influenza,inj,Quad PF,6+ Mos 09/10/2019   PFIZER Comirnaty(Gray Top)Covid-19 Tri-Sucrose Vaccine 02/21/2020, 03/17/2020   Tdap 09/10/2019    Past Medical History:  Diagnosis Date   Depression    Diverticulitis 2017   Heart murmur     Tobacco History: Social History   Tobacco Use  Smoking Status Never  Smokeless Tobacco Never    Counseling given: Not Answered   Outpatient Medications Prior to Visit  Medication Sig Dispense Refill   EPINEPHrine 0.3 mg/0.3 mL IJ SOAJ injection Inject 0.3 mg into the muscle as needed for anaphylaxis. 1 each 0   testosterone cypionate (DEPOTESTOSTERONE CYPIONATE) 200 MG/ML injection Inject 0.5 mLs (100 mg total) into the muscle once a week. (Patient taking differently: Inject 100 mg into the muscle 3 (three) times a week.) 10 mL 0   venlafaxine XR (EFFEXOR-XR) 150 MG 24 hr capsule TAKE 1 CAPSULE BY MOUTH DAILY WITH BREAKFAST. (Patient taking differently: Take 150 mg by mouth at bedtime.) 90 capsule 4   amoxicillin-clavulanate (AUGMENTIN) 875-125 MG tablet Take 1 tablet by mouth 2 (two) times daily. 20 tablet 0   predniSONE (DELTASONE) 20 MG tablet Take 2 tablets (40 mg total) by mouth daily. 10 tablet 0   No facility-administered medications prior to visit.      Review of Systems  Review of Systems  Constitutional:  Positive for fatigue.  HENT: Negative.    Respiratory:  Positive for apnea.   Cardiovascular: Negative.   Psychiatric/Behavioral:  Positive for sleep disturbance.     Physical Exam  BP 130/80 (BP Location: Left Arm, Cuff Size: Large)   Pulse 99   Ht 5\' 10"  (1.778 m)   Wt 221 lb 9.6 oz (100.5 kg)   SpO2 97%   BMI 31.80 kg/m  Physical Exam Constitutional:      Appearance: Normal appearance.  HENT:     Head: Normocephalic and  atraumatic.     Mouth/Throat:     Mouth: Mucous membranes are moist.     Pharynx: Oropharynx is clear.  Cardiovascular:     Rate and Rhythm: Normal rate and regular rhythm.  Pulmonary:     Effort: Pulmonary effort is normal.     Breath sounds: Normal breath sounds.  Skin:    General: Skin is warm and dry.  Neurological:     General: No focal deficit present.     Mental Status: He is alert and oriented to person, place, and time. Mental status is at baseline.  Psychiatric:        Mood and Affect: Mood normal.         Behavior: Behavior normal.        Thought Content: Thought content normal.        Judgment: Judgment normal.      Lab Results:  CBC    Component Value Date/Time   WBC 9.9 10/05/2023 0425   RBC 5.35 10/05/2023 0425   HGB 16.0 10/05/2023 0425   HCT 47.9 10/05/2023 0425   PLT 174 10/05/2023 0425   MCV 89.5 10/05/2023 0425   MCH 29.9 10/05/2023 0425   MCHC 33.4 10/05/2023 0425   RDW 13.1 10/05/2023 0425   LYMPHSABS 2.0 03/17/2023 0343   MONOABS 1.8 (H) 03/17/2023 0343   EOSABS 0.1 03/17/2023 0343   BASOSABS 0.1 03/17/2023 0343    BMET    Component Value Date/Time   NA 135 10/04/2023 0435   NA 142 04/09/2016 0000   K 4.0 10/04/2023 0435   CL 104 10/04/2023 0435   CO2 22 10/04/2023 0435   GLUCOSE 116 (H) 10/04/2023 0435   BUN 11 10/04/2023 0435   BUN 20 04/09/2016 0000   CREATININE 1.14 10/04/2023 0435   CREATININE 1.22 08/11/2020 1330   CALCIUM 8.1 (L) 10/04/2023 0435   GFRNONAA >60 10/04/2023 0435    BNP No results found for: "BNP"  ProBNP No results found for: "PROBNP"  Imaging: DG C-Arm 1-60 Min-No Report  Result Date: 10/03/2023 Fluoroscopy was utilized by the requesting physician.  No radiographic interpretation.     Assessment & Plan:   Loud snoring - Patient has symptoms of loud snoring, witness apnea and non-restorative sleep. No previous sleep studies. Epworth score 9/24. Needs home sleep study to assess for OSA. Reviewed risked on untreated sleep apnea including cardiac arrhthymias, pulm HTN, stoke and diabetes. We also discussed treatment options including weight loss, oral appliance, CPAP therapy or referral to ENT for possible surgical options.    Sleep apnea is defined as period of 10 seconds or longer when you stop breathing at night. This can happen multiple times a night. Dx sleep apnea is when this occurs more than 5 times an hour.    Mild OSA 5-15 apneic events an hour Moderate OSA 15-30 apneic events an hour Severe OSA > 30 apneic events  an hour   Untreated sleep apnea puts you at higher risk for cardiac arrhythmias, pulmonary HTN, stroke and diabetes  Treatment options include weight loss, side sleeping position, oral appliance, CPAP therapy or referral to ENT for possible surgical options    Recommendations: Focus on side sleeping position or elevate head with wedge pillow 30 degrees Work on weight loss efforts if able  Do not drive if experiencing excessive daytime sleepiness of fatigue    Orders: Home sleep study re: loud snoring    Follow-up: Please call to schedule follow-up 1-2 weeks after completing home sleep  study to review results and treatment if needed (can be virtual)   Glenford Bayley, NP 10/09/2023

## 2023-09-20 NOTE — Patient Instructions (Signed)

## 2023-09-21 ENCOUNTER — Other Ambulatory Visit: Payer: Self-pay | Admitting: Urology

## 2023-09-23 ENCOUNTER — Ambulatory Visit: Payer: Self-pay | Admitting: General Surgery

## 2023-09-23 DIAGNOSIS — K5732 Diverticulitis of large intestine without perforation or abscess without bleeding: Secondary | ICD-10-CM

## 2023-09-23 NOTE — Progress Notes (Signed)
Surgery orders requested via Epic inbox. °

## 2023-09-26 NOTE — Progress Notes (Signed)
COVID Vaccine Completed: yes  Date of COVID positive in last 90 days:  PCP - Abbe Amsterdam, MD Cardiologist -   Chest x-ray -  EKG - 05/31/23 Epic- sinus tachy at 102 Stress Test -  ECHO -  Cardiac Cath -  Pacemaker/ICD device last checked: Spinal Cord Stimulator:  Bowel Prep -   Sleep Study - needs study CPAP -   Fasting Blood Sugar -  Checks Blood Sugar _____ times a day  Last dose of GLP1 agonist-  N/A GLP1 instructions:  N/A   Last dose of SGLT-2 inhibitors-  N/A SGLT-2 instructions: N/A   Blood Thinner Instructions:  Time Aspirin Instructions: Last Dose:  Activity level:  Can go up a flight of stairs and perform activities of daily living without stopping and without symptoms of chest pain or shortness of breath.  Able to exercise without symptoms  Unable to go up a flight of stairs without symptoms of     Anesthesia review:   Patient denies shortness of breath, fever, cough and chest pain at PAT appointment  Patient verbalized understanding of instructions that were given to them at the PAT appointment. Patient was also instructed that they will need to review over the PAT instructions again at home before surgery.

## 2023-09-26 NOTE — Patient Instructions (Signed)
SURGICAL WAITING ROOM VISITATION  Patients having surgery or a procedure may have no more than 2 support people in the waiting area - these visitors may rotate.    Children under the age of 76 must have an adult with them who is not the patient.  Due to an increase in RSV and influenza rates and associated hospitalizations, children ages 22 and under may not visit patients in [facility] hospitals.  If the patient needs to stay at the hospital during part of their recovery, the visitor guidelines for inpatient rooms apply. Pre-op nurse will coordinate an appropriate time for 1 support person to accompany patient in pre-op.  This support person may not rotate.    Please refer to the [facility] website for the visitor guidelines for Inpatients (after your surgery is over and you are in a regular room).    Your procedure is scheduled on: 10/03/23   Report to [facility] Main Entrance    Report to admitting at 9:15 AM   Call this number if you have problems the morning of surgery [phone]   Follow a clear liquid diet the day before surgery.   You may have the following liquids until 8:30 AM DAY OF SURGERY  Water Non-Citrus Juices (without pulp, NO RED-Apple, White grape, White cranberry) Black Coffee (NO MILK/CREAM OR CREAMERS, sugar ok)  Clear Tea (NO MILK/CREAM OR CREAMERS, sugar ok) regular and decaf                             Plain Jell-O (NO RED)                                           Fruit ices (not with fruit pulp, NO RED)                                     Popsicles (NO RED)                                                               Sports drinks like Gatorade (NO RED)              Drink 2 Ensure/G2 drinks AT 10:00 PM the night before surgery.        The day of surgery:  Drink ONE (1) Pre-Surgery Clear Ensure or G2 at 8:30 AM the morning of surgery. Drink in one sitting. Do not sip.  This drink was given to you during your hospital  pre-op appointment  visit. Nothing else to drink after completing the  Pre-Surgery Clear Ensure or G2.          If you have questions, please contact your surgeon's office.   FOLLOW BOWEL PREP AND ANY ADDITIONAL PRE OP INSTRUCTIONS YOU RECEIVED FROM YOUR SURGEON'S OFFICE!!!     Oral Hygiene is also important to reduce your risk of infection.  Remember - BRUSH YOUR TEETH THE MORNING OF SURGERY WITH YOUR REGULAR TOOTHPASTE  DENTURES WILL BE REMOVED PRIOR TO SURGERY PLEASE DO NOT APPLY "Poly grip" OR ADHESIVES!!!   Do NOT smoke after Midnight   Stop all vitamins and herbal supplements 7 days before surgery.   Take these medicines the morning of surgery with A SIP OF WATER: None  DO NOT TAKE ANY ORAL DIABETIC MEDICATIONS DAY OF YOUR SURGERY  Bring CPAP mask and tubing day of surgery.                              You may not have any metal on your body including jewelry, and body piercing             Do not wear lotions, powders, cologne, or deodorant              Men may shave face and neck.   Do not bring valuables to the hospital. [facility] IS NOT             RESPONSIBLE   FOR VALUABLES.   Contacts, glasses, dentures or bridgework may not be worn into surgery.   Bring small overnight bag day of surgery.   DO NOT BRING YOUR HOME MEDICATIONS TO THE HOSPITAL. PHARMACY WILL DISPENSE MEDICATIONS LISTED ON YOUR MEDICATION LIST TO YOU DURING YOUR ADMISSION IN THE HOSPITAL!    Special Instructions: Bring a copy of your healthcare power of attorney and living will documents the day of surgery if you haven't scanned them before.              Please read over the following fact sheets you were given: IF YOU HAVE QUESTIONS ABOUT YOUR PRE-OP INSTRUCTIONS PLEASE CALL [name]    If you received a COVID test during your pre-op visit  it is requested that you wear a mask when out in public, stay away from anyone that may not be feeling well and notify your  surgeon if you develop symptoms. If you test positive for Covid or have been in contact with anyone that has tested positive in the last 10 days please notify you surgeon.    [facility] - Preparing for Surgery Before surgery, you can play an important role.  Because skin is not sterile, your skin needs to be as free of germs as possible.  You can reduce the number of germs on your skin by washing with CHG (chlorahexidine gluconate) soap before surgery.  CHG is an antiseptic cleaner which kills germs and bonds with the skin to continue killing germs even after washing. Please DO NOT use if you have an allergy to CHG or antibacterial soaps.  If your skin becomes reddened/irritated stop using the CHG and inform your nurse when you arrive at Short Stay. Do not shave (including legs and underarms) for at least 48 hours prior to the first CHG shower.  You may shave your face/neck.  Please follow these instructions carefully:  1.  Shower with CHG Soap the night before surgery and the  morning of surgery.  2.  If you choose to wash your hair, wash your hair first as usual with your normal  shampoo.  3.  After you shampoo, rinse your hair and body thoroughly to remove the shampoo.  4.  Use CHG as you would any other liquid soap.  You can apply chg directly to the skin and wash.  Gently with a scrungie or clean washcloth.  5.  Apply the CHG Soap to your body ONLY FROM THE NECK DOWN.   Do   not use on face/ open                           Wound or open sores. Avoid contact with eyes, ears mouth and   genitals (private parts).                       Wash face,  Genitals (private parts) with your normal soap.             6.  Wash thoroughly, paying special attention to the area where your    surgery  will be performed.  7.  Thoroughly rinse your body with warm water from the neck down.  8.  DO NOT shower/wash with your normal soap after using and rinsing off the CHG Soap.                 9.  Pat yourself dry with a clean towel.            10.  Wear clean pajamas.            11.  Place clean sheets on your bed the night of your first shower and do not  sleep with pets. Day of Surgery : Do not apply any lotions/deodorants the morning of surgery.  Please wear clean clothes to the hospital/surgery center.  FAILURE TO FOLLOW THESE INSTRUCTIONS MAY RESULT IN THE CANCELLATION OF YOUR SURGERY  PATIENT SIGNATURE_________________________________  NURSE SIGNATURE__________________________________  ________________________________________________________________________  Brian [name]  An incentive spirometer is a tool that can help keep your lungs clear and active. This tool measures how well you are filling your lungs with each breath. Taking long deep breaths may help reverse or decrease the chance of developing breathing (pulmonary) problems (especially infection) following: A long period of time when you are unable to move or be active. BEFORE THE PROCEDURE  If the spirometer includes an indicator to show your best effort, your nurse or respiratory therapist will set it to a desired goal. If possible, sit up straight or lean slightly forward. Try not to slouch. Hold the incentive spirometer in an upright position. INSTRUCTIONS FOR USE  Sit on the edge of your bed if possible, or sit up as far as you can in bed or on a chair. Hold the incentive spirometer in an upright position. Breathe out normally. Place the mouthpiece in your mouth and seal your lips tightly around it. Breathe in slowly and as deeply as possible, raising the piston or the ball toward the top of the column. Hold your breath for 3-5 seconds or for as long as possible. Allow the piston or ball to fall to the bottom of the column. Remove the mouthpiece from your mouth and breathe out normally. Rest for a few seconds and repeat Steps 1 through 7 at least 10 times every 1-2 hours when you are awake. Take  your time and take a few normal breaths between deep breaths. The spirometer may include an indicator to show your best effort. Use the indicator as a goal to work toward during each repetition. After each set of 10 deep breaths, practice coughing to be sure your  lungs are clear. If you have an incision (the cut made at the time of surgery), support your incision when coughing by placing a pillow or rolled up towels firmly against it. Once you are able to get out of bed, walk around indoors and cough well. You may stop using the incentive spirometer when instructed by your caregiver.  RISKS AND COMPLICATIONS Take your time so you do not get dizzy or light-headed. If you are in pain, you may need to take or ask for pain medication before doing incentive spirometry. It is harder to take a deep breath if you are having pain. AFTER USE Rest and breathe slowly and easily. It can be helpful to keep track of a log of your progress. Your caregiver can provide you with a simple table to help with this. If you are using the spirometer at home, follow these instructions: SEEK MEDICAL CARE IF:  You are having difficultly using the spirometer. You have trouble using the spirometer as often as instructed. Your pain medication is not giving enough relief while using the spirometer. You develop fever of 100.5 F (38.1 C) or higher. SEEK IMMEDIATE MEDICAL CARE IF:  You cough up bloody sputum that had not been present before. You develop fever of 102 F (38.9 C) or greater. You develop worsening pain at or near the incision site. MAKE SURE YOU:  Understand these instructions. Will watch your condition. Will get help right away if you are not doing well or get worse. Document Released: 03/28/2007 Document Revised: 02/07/2012 Document Reviewed: 05/29/2007 Exit[employer] Patient Information 2014 Exit[employer], [place].   ________________________________________________________________________ WHAT IS A BLOOD  TRANSFUSION? Blood Transfusion Information  A transfusion is the replacement of blood or some of its parts. Blood is made up of multiple cells which provide different functions. Red blood cells carry oxygen and are used for blood loss replacement. White blood cells fight against infection. Platelets control bleeding. Plasma helps clot blood. Other blood products are available for specialized needs, such as hemophilia or other clotting disorders. BEFORE THE TRANSFUSION  Who gives blood for transfusions?  Healthy volunteers who are fully evaluated to make sure their blood is safe. This is blood bank blood. Transfusion therapy is the safest it has ever been in the practice of medicine. Before blood is taken from a donor, a complete history is taken to make sure that person has no history of diseases nor engages in risky social behavior (examples are intravenous drug use or sexual activity with multiple partners). The donor's travel history is screened to minimize risk of transmitting infections, such as malaria. The donated blood is tested for signs of infectious diseases, such as HIV and hepatitis. The blood is then tested to be sure it is compatible with you in order to minimize the chance of a transfusion reaction. If you or a relative donates blood, this is often done in anticipation of surgery and is not appropriate for emergency situations. It takes many days to process the donated blood. RISKS AND COMPLICATIONS Although transfusion therapy is very safe and saves many lives, the main dangers of transfusion include:  Getting an infectious disease. Developing a transfusion reaction. This is an allergic reaction to something in the blood you were given. Every precaution is taken to prevent this. The decision to have a blood transfusion has been considered carefully by your caregiver before blood is given. Blood is not given unless the benefits outweigh the risks. AFTER THE TRANSFUSION Right after  receiving a blood transfusion, you  will usually feel much better and more energetic. This is especially true if your red blood cells have gotten low (anemic). The transfusion raises the level of the red blood cells which carry oxygen, and this usually causes an energy increase. The nurse administering the transfusion will monitor you carefully for complications. HOME CARE INSTRUCTIONS  No special instructions are needed after a transfusion. You may find your energy is better. Speak with your caregiver about any limitations on activity for underlying diseases you may have. SEEK MEDICAL CARE IF:  Your condition is not improving after your transfusion. You develop redness or irritation at the intravenous (IV) site. SEEK IMMEDIATE MEDICAL CARE IF:  Any of the following symptoms occur over the next 12 hours: Shaking chills. You have a temperature by mouth above 102 F (38.9 C), not controlled by medicine. Chest, back, or muscle pain. People around you feel you are not acting correctly or are confused. Shortness of breath or difficulty breathing. Dizziness and fainting. You get a rash or develop hives. You have a decrease in urine output. Your urine turns a dark color or changes to pink, red, or brown. Any of the following symptoms occur over the next 10 days: You have a temperature by mouth above 102 F (38.9 C), not controlled by medicine. Shortness of breath. Weakness after normal activity. The white part of the eye turns yellow (jaundice). You have a decrease in the amount of urine or are urinating less often. Your urine turns a dark color or changes to pink, red, or brown. Document Released: 11/12/2000 Document Revised: 02/07/2012 Document Reviewed: 07/01/2008 [facility] Patient Information 2014 [place], [place].  _______________________________________________________________________

## 2023-09-26 NOTE — Progress Notes (Signed)
Please place consent order.

## 2023-09-27 ENCOUNTER — Encounter (HOSPITAL_COMMUNITY): Payer: Self-pay

## 2023-09-27 ENCOUNTER — Encounter (HOSPITAL_COMMUNITY)
Admission: RE | Admit: 2023-09-27 | Discharge: 2023-09-27 | Disposition: A | Discharge status: Home / Self Care | Payer: 59 | Source: Ambulatory Visit | Attending: General Surgery | Admitting: General Surgery

## 2023-09-27 ENCOUNTER — Other Ambulatory Visit: Payer: Self-pay

## 2023-09-27 VITALS — BP 140/87 | HR 86 | Temp 98.2°F | Resp 16 | Ht 70.0 in | Wt 211.0 lb

## 2023-09-27 DIAGNOSIS — K5732 Diverticulitis of large intestine without perforation or abscess without bleeding: Secondary | ICD-10-CM | POA: Insufficient documentation

## 2023-09-27 DIAGNOSIS — Z01812 Encounter for preprocedural laboratory examination: Secondary | ICD-10-CM | POA: Diagnosis present

## 2023-09-27 DIAGNOSIS — Z01818 Encounter for other preprocedural examination: Secondary | ICD-10-CM

## 2023-09-27 HISTORY — DX: Depression, unspecified: F32.A

## 2023-09-27 HISTORY — DX: Cardiac murmur, unspecified: R01.1

## 2023-09-27 LAB — HEMOGLOBIN A1C
Hgb A1c MFr Bld: 4.8 % (ref 4.8–5.6)
Mean Plasma Glucose: 91.06 mg/dL

## 2023-09-27 LAB — CBC
HCT: 54.5 % — ABNORMAL HIGH (ref 39.0–52.0)
Hemoglobin: 18.3 g/dL — ABNORMAL HIGH (ref 13.0–17.0)
MCH: 29.7 pg (ref 26.0–34.0)
MCHC: 33.6 g/dL (ref 30.0–36.0)
MCV: 88.5 fL (ref 80.0–100.0)
Platelets: 241 10*3/uL (ref 150–400)
RBC: 6.16 MIL/uL — ABNORMAL HIGH (ref 4.22–5.81)
RDW: 12.8 % (ref 11.5–15.5)
WBC: 7.7 10*3/uL (ref 4.0–10.5)
nRBC: 0 % (ref 0.0–0.2)

## 2023-10-02 NOTE — H&P (Incomplete)
History of present illness: 39 M w/ hx of diverticulitis getting a hemicolectomy today. Urology was asked to place firefly priro to starting the procedure.    Review of systems: A 12 point comprehensive review of systems was obtained and is negative unless otherwise stated in the history of present illness.  Patient Active Problem List   Diagnosis Date Noted   Diverticulitis 03/16/2023   Diverticulitis of colon 10/28/2016   Injury of right hand 04/19/2012    No current facility-administered medications on file prior to encounter.   Current Outpatient Medications on File Prior to Encounter  Medication Sig Dispense Refill   EPINEPHrine 0.3 mg/0.3 mL IJ SOAJ injection Inject 0.3 mg into the muscle as needed for anaphylaxis. 1 each 0   testosterone cypionate (DEPOTESTOSTERONE CYPIONATE) 200 MG/ML injection Inject 0.5 mLs (100 mg total) into the muscle once a week. (Patient taking differently: Inject 100 mg into the muscle 3 (three) times a week.) 10 mL 0   tretinoin (RETIN-A) 0.05 % cream Apply 1 application  topically at bedtime as needed (acne).     venlafaxine XR (EFFEXOR-XR) 150 MG 24 hr capsule TAKE 1 CAPSULE BY MOUTH DAILY WITH BREAKFAST. (Patient taking differently: Take 150 mg by mouth at bedtime.) 90 capsule 4    Past Medical History:  Diagnosis Date   Depression    Diverticulitis 2017   Heart murmur     Past Surgical History:  Procedure Laterality Date   COLONOSCOPY     SHOULDER ARTHROSCOPY Bilateral     Social History   Tobacco Use   Smoking status: Never   Smokeless tobacco: Never  Vaping Use   Vaping status: Never Used  Substance Use Topics   Alcohol use: Yes    Alcohol/week: 8.0 standard drinks of alcohol    Types: 8 Cans of beer per week   Drug use: No    Family History  Problem Relation Age of Onset   Heart attack Father    Diabetes Neg Hx    Hyperlipidemia Neg Hx    Hypertension Neg Hx    Sudden death Neg Hx    Colon cancer Neg Hx    Stomach  cancer Neg Hx    Pancreatic cancer Neg Hx    Esophageal cancer Neg Hx    Liver cancer Neg Hx     PE: There were no vitals filed for this visit. Patient appears to be in no acute distress  patient is alert and oriented x3 Atraumatic normocephalic head No cervical or supraclavicular lymphadenopathy appreciated No increased work of breathing, no audible wheezes/rhonchi Regular sinus rhythm/rate Abdomen is soft, nontender, nondistended, no CVA or suprapubic tenderness Lower extremities are symmetric without appreciable edema Grossly neurologically intact No identifiable skin lesions  No results for input(s): "WBC", "HGB", "HCT" in the last 72 hours. No results for input(s): "NA", "K", "CL", "CO2", "GLUCOSE", "BUN", "CREATININE", "CALCIUM" in the last 72 hours. No results for input(s): "LABPT", "INR" in the last 72 hours. No results for input(s): "LABURIN" in the last 72 hours. Results for orders placed or performed in visit on 03/30/23  Urine Culture     Status: None   Collection Time: 03/30/23  1:14 PM   Specimen: Urine  Result Value Ref Range Status   MICRO NUMBER: [id number]  Final   SPECIMEN QUALITY: Adequate  Final   Sample Source NOT GIVEN  Final   STATUS: FINAL  Final   Result: No Growth  Final    Imaging: CT shows no  abnormalities to bladder or ureters none  A/P: 57 M w/ hx of diverticulitis getting a hemicolectomy today. Urology was asked to place firefly priro to starting the procedure.   We discussed risk benefits alternatives to the procedure including bleeding infection damage to the ureter as well as urethra.  Patient voiced their understanding and informed consent was obtained.  Thank you for involving me in this patient's care, ***I will continue to follow along.***Please page with any further questions or concerns. Adonis Brook

## 2023-10-03 ENCOUNTER — Inpatient Hospital Stay (HOSPITAL_COMMUNITY): Payer: 59

## 2023-10-03 ENCOUNTER — Encounter (HOSPITAL_COMMUNITY)
Admission: RE | Disposition: A | Discharge status: Home / Self Care | Payer: Self-pay | Source: Home / Self Care | Attending: General Surgery

## 2023-10-03 ENCOUNTER — Inpatient Hospital Stay (HOSPITAL_COMMUNITY)
Admission: RE | Admit: 2023-10-03 | Discharge: 2023-10-05 | DRG: 331 | Disposition: A | Discharge status: Home / Self Care | Payer: 59 | Attending: General Surgery | Admitting: General Surgery

## 2023-10-03 ENCOUNTER — Other Ambulatory Visit: Payer: Self-pay

## 2023-10-03 ENCOUNTER — Inpatient Hospital Stay (HOSPITAL_COMMUNITY): Payer: 59 | Admitting: Physician Assistant

## 2023-10-03 ENCOUNTER — Inpatient Hospital Stay (HOSPITAL_COMMUNITY): Payer: 59 | Admitting: Anesthesiology

## 2023-10-03 ENCOUNTER — Encounter (HOSPITAL_COMMUNITY): Payer: Self-pay | Admitting: General Surgery

## 2023-10-03 DIAGNOSIS — Z87892 Personal history of anaphylaxis: Secondary | ICD-10-CM

## 2023-10-03 DIAGNOSIS — K5720 Diverticulitis of large intestine with perforation and abscess without bleeding: Secondary | ICD-10-CM | POA: Diagnosis present

## 2023-10-03 DIAGNOSIS — K660 Peritoneal adhesions (postprocedural) (postinfection): Secondary | ICD-10-CM | POA: Diagnosis present

## 2023-10-03 DIAGNOSIS — K5939 Other megacolon: Secondary | ICD-10-CM | POA: Diagnosis present

## 2023-10-03 DIAGNOSIS — Z8249 Family history of ischemic heart disease and other diseases of the circulatory system: Secondary | ICD-10-CM

## 2023-10-03 DIAGNOSIS — Z79899 Other long term (current) drug therapy: Secondary | ICD-10-CM

## 2023-10-03 DIAGNOSIS — Z91030 Bee allergy status: Secondary | ICD-10-CM

## 2023-10-03 DIAGNOSIS — K5732 Diverticulitis of large intestine without perforation or abscess without bleeding: Principal | ICD-10-CM | POA: Diagnosis present

## 2023-10-03 DIAGNOSIS — Z91038 Other insect allergy status: Secondary | ICD-10-CM

## 2023-10-03 DIAGNOSIS — Z79890 Hormone replacement therapy (postmenopausal): Secondary | ICD-10-CM | POA: Diagnosis not present

## 2023-10-03 LAB — CBC
HCT: 53.3 % — ABNORMAL HIGH (ref 39.0–52.0)
Hemoglobin: 17.9 g/dL — ABNORMAL HIGH (ref 13.0–17.0)
MCH: 30 pg (ref 26.0–34.0)
MCHC: 33.6 g/dL (ref 30.0–36.0)
MCV: 89.4 fL (ref 80.0–100.0)
Platelets: 222 10*3/uL (ref 150–400)
RBC: 5.96 MIL/uL — ABNORMAL HIGH (ref 4.22–5.81)
RDW: 12.9 % (ref 11.5–15.5)
WBC: 13.5 10*3/uL — ABNORMAL HIGH (ref 4.0–10.5)
nRBC: 0 % (ref 0.0–0.2)

## 2023-10-03 LAB — TYPE AND SCREEN
ABO/RH(D): O POS
Antibody Screen: NEGATIVE

## 2023-10-03 LAB — ABO/RH: ABO/RH(D): O POS

## 2023-10-03 LAB — CREATININE, SERUM
Creatinine, Ser: 1.21 mg/dL (ref 0.61–1.24)
GFR, Estimated: >60 mL/min (ref >60)

## 2023-10-03 SURGERY — COLECTOMY, PARTIAL, ROBOT-ASSISTED, LAPAROSCOPIC
Anesthesia: General | Site: Bladder

## 2023-10-03 MED ORDER — MIDAZOLAM HCL 5 MG/5ML IJ SOLN
INTRAMUSCULAR | Status: DC | PRN
  Administered 2023-10-03 (×2): 1 mg via INTRAVENOUS

## 2023-10-03 MED ORDER — SODIUM CHLORIDE 0.9 % IV SOLN
2.0000 g | INTRAVENOUS | Status: AC
  Administered 2023-10-03: 2 g via INTRAVENOUS
  Filled 2023-10-03: qty 2

## 2023-10-03 MED ORDER — ONDANSETRON HCL 4 MG/2ML IJ SOLN
INTRAMUSCULAR | Status: AC
Start: 2023-10-03 — End: ?
  Filled 2023-10-03: qty 2

## 2023-10-03 MED ORDER — 0.9 % SODIUM CHLORIDE (POUR BTL) OPTIME
TOPICAL | Status: DC | PRN
  Administered 2023-10-03: 2000 mL

## 2023-10-03 MED ORDER — EPHEDRINE SULFATE (PRESSORS) 50 MG/ML IJ SOLN
INTRAMUSCULAR | Status: DC | PRN
  Administered 2023-10-03 (×2): 5 mg via INTRAVENOUS

## 2023-10-03 MED ORDER — IBUPROFEN 400 MG PO TABS
600.0000 mg | ORAL_TABLET | Freq: Four times a day (QID) | ORAL | Status: DC | PRN
  Administered 2023-10-05: 600 mg via ORAL
  Filled 2023-10-03: qty 1

## 2023-10-03 MED ORDER — DIPHENHYDRAMINE HCL 50 MG/ML IJ SOLN: 25.0000 mg | Freq: Four times a day (QID) | INTRAMUSCULAR | Status: DC | PRN

## 2023-10-03 MED ORDER — SIMETHICONE 80 MG PO CHEW: 40.0000 mg | CHEWABLE_TABLET | Freq: Four times a day (QID) | ORAL | Status: DC | PRN

## 2023-10-03 MED ORDER — METOPROLOL TARTRATE 5 MG/5ML IV SOLN: 5.0000 mg | Freq: Four times a day (QID) | INTRAVENOUS | Status: DC | PRN

## 2023-10-03 MED ORDER — ONDANSETRON HCL 4 MG/2ML IJ SOLN
INTRAMUSCULAR | Status: DC | PRN
  Administered 2023-10-03: 4 mg via INTRAVENOUS

## 2023-10-03 MED ORDER — DIPHENHYDRAMINE HCL 25 MG PO CAPS: 25.0000 mg | ORAL_CAPSULE | Freq: Four times a day (QID) | ORAL | Status: DC | PRN

## 2023-10-03 MED ORDER — MORPHINE SULFATE (PF) 2 MG/ML IV SOLN
2.0000 mg | INTRAVENOUS | Status: DC | PRN
Start: 2023-10-03 — End: 2023-10-05

## 2023-10-03 MED ORDER — LIDOCAINE HCL (CARDIAC) PF 100 MG/5ML IV SOSY
PREFILLED_SYRINGE | INTRAVENOUS | Status: DC | PRN
  Administered 2023-10-03: 60 mg via INTRAVENOUS

## 2023-10-03 MED ORDER — DEXAMETHASONE SODIUM PHOSPHATE 10 MG/ML IJ SOLN
INTRAMUSCULAR | Status: DC | PRN
  Administered 2023-10-03 (×2): 4 mg via INTRAVENOUS

## 2023-10-03 MED ORDER — ENSURE PRE-SURGERY PO LIQD
296.0000 mL | Freq: Once | ORAL | Status: DC
  Filled 2023-10-03: qty 296

## 2023-10-03 MED ORDER — BUPIVACAINE-EPINEPHRINE 0.25% -1:200000 IJ SOLN
INTRAMUSCULAR | Status: DC | PRN
  Administered 2023-10-03: 30 mL

## 2023-10-03 MED ORDER — ENSURE PRE-SURGERY PO LIQD
592.0000 mL | Freq: Once | ORAL | Status: DC
  Filled 2023-10-03: qty 592

## 2023-10-03 MED ORDER — OXYCODONE HCL 5 MG PO TABS
5.0000 mg | ORAL_TABLET | Freq: Four times a day (QID) | ORAL | Status: DC | PRN
  Administered 2023-10-04 (×2): 5 mg via ORAL
  Filled 2023-10-03 (×3): qty 1

## 2023-10-03 MED ORDER — ORAL CARE MOUTH RINSE: 15.0000 mL | Freq: Once | OROMUCOSAL | Status: AC

## 2023-10-03 MED ORDER — HEPARIN SODIUM (PORCINE) 5000 UNIT/ML IJ SOLN
5000.0000 [IU] | Freq: Once | INTRAMUSCULAR | Status: AC
  Administered 2023-10-03: 5000 [IU] via SUBCUTANEOUS
  Filled 2023-10-03: qty 1

## 2023-10-03 MED ORDER — ALVIMOPAN 12 MG PO CAPS
12.0000 mg | ORAL_CAPSULE | Freq: Two times a day (BID) | ORAL | Status: DC
  Administered 2023-10-04: 12 mg via ORAL
  Filled 2023-10-03 (×2): qty 1

## 2023-10-03 MED ORDER — HYDROMORPHONE HCL 1 MG/ML IJ SOLN
INTRAMUSCULAR | Status: DC | PRN
  Administered 2023-10-03: .25 mg via INTRAVENOUS

## 2023-10-03 MED ORDER — STERILE WATER FOR INJECTION IJ SOLN
INTRAMUSCULAR | Status: DC | PRN
  Administered 2023-10-03: 15 mL via INTRAMUSCULAR

## 2023-10-03 MED ORDER — CHLORHEXIDINE GLUCONATE 0.12 % MT SOLN
15.0000 mL | Freq: Once | OROMUCOSAL | Status: AC
  Administered 2023-10-03: 15 mL via OROMUCOSAL

## 2023-10-03 MED ORDER — BUPIVACAINE LIPOSOME 1.3 % IJ SUSP
20.0000 mL | Freq: Once | INTRAMUSCULAR | Status: AC
  Administered 2023-10-03: 20 mL

## 2023-10-03 MED ORDER — ACETAMINOPHEN 500 MG PO TABS
1000.0000 mg | ORAL_TABLET | Freq: Four times a day (QID) | ORAL | Status: DC
  Administered 2023-10-03 – 2023-10-05 (×6): 1000 mg via ORAL
  Filled 2023-10-03 (×7): qty 2

## 2023-10-03 MED ORDER — ENSURE SURGERY PO LIQD
237.0000 mL | Freq: Two times a day (BID) | ORAL | Status: DC
Start: 2023-10-04 — End: 2023-10-05
  Administered 2023-10-04 – 2023-10-05 (×3): 237 mL via ORAL

## 2023-10-03 MED ORDER — LACTATED RINGERS IV SOLN: INTRAVENOUS | Status: DC

## 2023-10-03 MED ORDER — HYDROMORPHONE HCL 1 MG/ML IJ SOLN
INTRAMUSCULAR | Status: AC
  Filled 2023-10-03: qty 1

## 2023-10-03 MED ORDER — LIDOCAINE HCL (PF) 2 % IJ SOLN
INTRAMUSCULAR | Status: AC
  Filled 2023-10-03: qty 5

## 2023-10-03 MED ORDER — ENOXAPARIN SODIUM 40 MG/0.4ML IJ SOSY
40.0000 mg | PREFILLED_SYRINGE | INTRAMUSCULAR | Status: DC
Start: 2023-10-04 — End: 2023-10-05
  Administered 2023-10-04 – 2023-10-05 (×2): 40 mg via SUBCUTANEOUS
  Filled 2023-10-03 (×2): qty 0.4

## 2023-10-03 MED ORDER — FENTANYL CITRATE (PF) 100 MCG/2ML IJ SOLN
INTRAMUSCULAR | Status: DC | PRN
  Administered 2023-10-03: 50 ug via INTRAVENOUS
  Administered 2023-10-03 (×3): 25 ug via INTRAVENOUS
  Administered 2023-10-03 (×2): 50 ug via INTRAVENOUS
  Administered 2023-10-03: 25 ug via INTRAVENOUS

## 2023-10-03 MED ORDER — METRONIDAZOLE 500 MG PO TABS: 1000.0000 mg | ORAL_TABLET | ORAL | Status: DC

## 2023-10-03 MED ORDER — INDOCYANINE GREEN 25 MG IV SOLR
10.0000 mg | Freq: Once | INTRAVENOUS | Status: DC
  Filled 2023-10-03: qty 10

## 2023-10-03 MED ORDER — PROPOFOL 10 MG/ML IV BOLUS
INTRAVENOUS | Status: AC
  Filled 2023-10-03: qty 20

## 2023-10-03 MED ORDER — MIDAZOLAM HCL 2 MG/2ML IJ SOLN
INTRAMUSCULAR | Status: AC
  Filled 2023-10-03: qty 2

## 2023-10-03 MED ORDER — ROCURONIUM BROMIDE 100 MG/10ML IV SOLN
INTRAVENOUS | Status: DC | PRN
  Administered 2023-10-03: 10 mg via INTRAVENOUS
  Administered 2023-10-03: 70 mg via INTRAVENOUS
  Administered 2023-10-03: 20 mg via INTRAVENOUS
  Administered 2023-10-03: 30 mg via INTRAVENOUS

## 2023-10-03 MED ORDER — STERILE WATER FOR INJECTION IJ SOLN
INTRAMUSCULAR | Status: AC
  Filled 2023-10-03: qty 10

## 2023-10-03 MED ORDER — PHENYLEPHRINE 80 MCG/ML (10ML) SYRINGE FOR IV PUSH (FOR BLOOD PRESSURE SUPPORT)
PREFILLED_SYRINGE | INTRAVENOUS | Status: AC
  Filled 2023-10-03: qty 10

## 2023-10-03 MED ORDER — SODIUM CHLORIDE 0.45 % IV SOLN: INTRAVENOUS | Status: AC

## 2023-10-03 MED ORDER — HYDROMORPHONE HCL 1 MG/ML IJ SOLN
0.2500 mg | INTRAMUSCULAR | Status: DC | PRN
Start: 2023-10-03 — End: 2023-10-03
  Administered 2023-10-03 (×4): 0.5 mg via INTRAVENOUS

## 2023-10-03 MED ORDER — DIPHENHYDRAMINE HCL 50 MG/ML IJ SOLN
INTRAMUSCULAR | Status: DC | PRN
  Administered 2023-10-03: 12.5 mg via INTRAVENOUS

## 2023-10-03 MED ORDER — ALVIMOPAN 12 MG PO CAPS
12.0000 mg | ORAL_CAPSULE | ORAL | Status: AC
  Administered 2023-10-03: 12 mg via ORAL
  Filled 2023-10-03: qty 1

## 2023-10-03 MED ORDER — PHENYLEPHRINE HCL (PRESSORS) 10 MG/ML IV SOLN
INTRAVENOUS | Status: DC | PRN
  Administered 2023-10-03: 100 ug via INTRAVENOUS

## 2023-10-03 MED ORDER — GABAPENTIN 300 MG PO CAPS
300.0000 mg | ORAL_CAPSULE | ORAL | Status: AC
  Administered 2023-10-03: 300 mg via ORAL
  Filled 2023-10-03: qty 1

## 2023-10-03 MED ORDER — CHLORHEXIDINE GLUCONATE 4 % EX SOLN: 1.0000 | Freq: Once | CUTANEOUS | Status: DC

## 2023-10-03 MED ORDER — NEOMYCIN SULFATE 500 MG PO TABS: 1000.0000 mg | ORAL_TABLET | ORAL | Status: DC

## 2023-10-03 MED ORDER — KETAMINE HCL 10 MG/ML IJ SOLN
INTRAMUSCULAR | Status: DC | PRN
  Administered 2023-10-03: 10 mg via INTRAVENOUS
  Administered 2023-10-03: 30 mg via INTRAVENOUS

## 2023-10-03 MED ORDER — DEXAMETHASONE SODIUM PHOSPHATE 10 MG/ML IJ SOLN
INTRAMUSCULAR | Status: AC
  Filled 2023-10-03: qty 1

## 2023-10-03 MED ORDER — KETAMINE HCL 50 MG/5ML IJ SOSY
PREFILLED_SYRINGE | INTRAMUSCULAR | Status: AC
  Filled 2023-10-03: qty 5

## 2023-10-03 MED ORDER — BUPIVACAINE-EPINEPHRINE 0.25% -1:200000 IJ SOLN
INTRAMUSCULAR | Status: AC
  Filled 2023-10-03: qty 1

## 2023-10-03 MED ORDER — HYDROMORPHONE HCL 2 MG/ML IJ SOLN
INTRAMUSCULAR | Status: AC
  Filled 2023-10-03: qty 1

## 2023-10-03 MED ORDER — FENTANYL CITRATE (PF) 250 MCG/5ML IJ SOLN
INTRAMUSCULAR | Status: AC
  Filled 2023-10-03: qty 5

## 2023-10-03 MED ORDER — SUGAMMADEX SODIUM 200 MG/2ML IV SOLN
INTRAVENOUS | Status: DC | PRN
  Administered 2023-10-03: 225 mg via INTRAVENOUS

## 2023-10-03 MED ORDER — ROCURONIUM BROMIDE 10 MG/ML (PF) SYRINGE
PREFILLED_SYRINGE | INTRAVENOUS | Status: AC
  Filled 2023-10-03: qty 10

## 2023-10-03 MED ORDER — ACETAMINOPHEN 500 MG PO TABS
1000.0000 mg | ORAL_TABLET | ORAL | Status: AC
  Administered 2023-10-03: 1000 mg via ORAL
  Filled 2023-10-03: qty 2

## 2023-10-03 MED ORDER — IOHEXOL 300 MG/ML  SOLN
INTRAMUSCULAR | Status: DC | PRN
  Administered 2023-10-03: 6 mL

## 2023-10-03 MED ORDER — DIPHENHYDRAMINE HCL 50 MG/ML IJ SOLN
INTRAMUSCULAR | Status: AC
  Filled 2023-10-03: qty 1

## 2023-10-03 MED ORDER — ONDANSETRON HCL 4 MG/2ML IJ SOLN
4.0000 mg | Freq: Four times a day (QID) | INTRAMUSCULAR | Status: DC | PRN
  Administered 2023-10-03: 4 mg via INTRAVENOUS
  Filled 2023-10-03: qty 2

## 2023-10-03 MED ORDER — ONDANSETRON HCL 4 MG PO TABS: 4.0000 mg | ORAL_TABLET | Freq: Four times a day (QID) | ORAL | Status: DC | PRN

## 2023-10-03 MED ORDER — LACTATED RINGERS IR SOLN
Status: DC | PRN
  Administered 2023-10-03: 1000 mL

## 2023-10-03 MED ORDER — PROPOFOL 10 MG/ML IV BOLUS
INTRAVENOUS | Status: DC | PRN
  Administered 2023-10-03: 200 mg via INTRAVENOUS

## 2023-10-03 SURGICAL SUPPLY — 101 items
ADAPTER GOLDBERG URETERAL (ADAPTER) IMPLANT
ADPR CATH 15X14FR FL DRN BG (ADAPTER)
BAG COUNTER SPONGE SURGICOUNT (BAG) ×2 IMPLANT
BAG SPNG CNTER NS LX DISP (BAG) ×2
BAG URO CATCHER STRL LF (MISCELLANEOUS) ×2 IMPLANT
BLADE EXTENDED COATED 6.5IN (ELECTRODE) IMPLANT
CANNULA REDUCER 12-8 DVNC XI (CANNULA) IMPLANT
CATH URETL OPEN 5X70 (CATHETERS) IMPLANT
CELLS DAT CNTRL 66122 CELL SVR (MISCELLANEOUS) IMPLANT
CLOTH BEACON ORANGE TIMEOUT ST (SAFETY) ×2 IMPLANT
COVER SURGICAL LIGHT HANDLE (MISCELLANEOUS) ×4 IMPLANT
COVER TIP SHEARS 8 DVNC (MISCELLANEOUS) ×2 IMPLANT
DRAIN CHANNEL 19F RND (DRAIN) IMPLANT
DRAPE ARM DVNC X/XI (DISPOSABLE) ×8 IMPLANT
DRAPE COLUMN DVNC XI (DISPOSABLE) ×2 IMPLANT
DRAPE SURG IRRIG POUCH 19X23 (DRAPES) ×2 IMPLANT
DRIVER NDL LRG 8 DVNC XI (INSTRUMENTS) ×2 IMPLANT
DRIVER NDLE LRG 8 DVNC XI (INSTRUMENTS) ×2 IMPLANT
DRSG OPSITE POSTOP 4X10 (GAUZE/BANDAGES/DRESSINGS) IMPLANT
DRSG OPSITE POSTOP 4X6 (GAUZE/BANDAGES/DRESSINGS) IMPLANT
DRSG OPSITE POSTOP 4X8 (GAUZE/BANDAGES/DRESSINGS) IMPLANT
ELECT PENCIL ROCKER SW 15FT (MISCELLANEOUS) ×2 IMPLANT
ELECT REM PT RETURN 15FT ADLT (MISCELLANEOUS) ×2 IMPLANT
ENDOLOOP SUT PDS II 0 18 (SUTURE) IMPLANT
EVACUATOR SILICONE 100CC (DRAIN) IMPLANT
GLOVE BIO SURGEON STRL SZ 6.5 (GLOVE) ×6 IMPLANT
GLOVE INDICATOR 6.5 STRL GRN (GLOVE) ×6 IMPLANT
GLOVE SURG LX STRL 8.0 MICRO (GLOVE) ×2 IMPLANT
GOWN SRG XL LVL 4 BRTHBL STRL (GOWNS) ×2 IMPLANT
GOWN STRL NON-REIN XL LVL4 (GOWNS) ×2
GOWN STRL REUS W/ TWL XL LVL3 (GOWN DISPOSABLE) ×8 IMPLANT
GOWN STRL REUS W/TWL XL LVL3 (GOWN DISPOSABLE) ×8
GRASPER SUT TROCAR 14GX15 (MISCELLANEOUS) IMPLANT
GRASPER TIP-UP FEN DVNC XI (INSTRUMENTS) ×2 IMPLANT
GUIDEWIRE ANG ZIPWIRE 038X150 (WIRE) IMPLANT
GUIDEWIRE STR DUAL SENSOR (WIRE) IMPLANT
HOLDER FOLEY CATH W/STRAP (MISCELLANEOUS) ×2 IMPLANT
IRRIG SUCT STRYKERFLOW 2 WTIP (MISCELLANEOUS) ×2 IMPLANT
IRRIGATION SUCT STRKRFLW 2 WTP (MISCELLANEOUS) ×2 IMPLANT
KIT PROCEDURE DVNC SI (MISCELLANEOUS) ×2 IMPLANT
KIT TURNOVER KIT A (KITS) IMPLANT
MANIFOLD NEPTUNE II (INSTRUMENTS) ×2 IMPLANT
NDL INSUFFLATION 14GA 120MM (NEEDLE) ×2 IMPLANT
NEEDLE INSUFFLATION 14GA 120MM (NEEDLE) ×2 IMPLANT
OBTURATOR OPTICAL STND 8 DVNC (TROCAR) ×2 IMPLANT
OBTURATOR OPTICALSTD 8 DVNC (TROCAR) IMPLANT
PACK CARDIOVASCULAR III (CUSTOM PROCEDURE TRAY) ×2 IMPLANT
PACK COLON (CUSTOM PROCEDURE TRAY) ×2 IMPLANT
PACK CYSTO (CUSTOM PROCEDURE TRAY) ×2 IMPLANT
PAD POSITIONING PINK XL (MISCELLANEOUS) ×2 IMPLANT
PAD PREP 24X48 CUFFED NSTRL (MISCELLANEOUS) ×2 IMPLANT
RELOAD STAPLE 60 3.5 BLU DVNC (STAPLE) IMPLANT
RELOAD STAPLE 60 4.3 GRN DVNC (STAPLE) IMPLANT
RELOAD STAPLER 3.5X60 BLU DVNC (STAPLE) ×2 IMPLANT
RELOAD STAPLER 4.3X60 GRN DVNC (STAPLE) IMPLANT
RETRACTOR WND ALEXIS 18 MED (MISCELLANEOUS) IMPLANT
RTRCTR WOUND ALEXIS 18CM MED (MISCELLANEOUS) IMPLANT
SCISSORS LAP 5X35 DISP (ENDOMECHANICALS) IMPLANT
SCISSORS MNPLR CVD DVNC XI (INSTRUMENTS) ×2 IMPLANT
SEAL UNIV 5-12 XI (MISCELLANEOUS) ×6 IMPLANT
SEALER VESSEL EXT DVNC XI (MISCELLANEOUS) ×2 IMPLANT
SOL ELECTROSURG ANTI STICK (MISCELLANEOUS) ×2 IMPLANT
SOLUTION ELECTROSURG ANTI STCK (MISCELLANEOUS) ×2 IMPLANT
SPIKE FLUID TRANSFER (MISCELLANEOUS) IMPLANT
STAPLER 60 SUREFORM DVNC (STAPLE) IMPLANT
STAPLER ECHELON POWER CIR 29 (STAPLE) IMPLANT
STAPLER ECHELON POWER CIR 31 (STAPLE) IMPLANT
STAPLER RELOAD 3.5X60 BLU DVNC (STAPLE) ×2 IMPLANT
STAPLER RELOAD 4.3X60 GRN DVNC (STAPLE) IMPLANT
STOPCOCK 4 WAY LG BORE MALE ST (IV SETS) ×4 IMPLANT
SUT ETHILON 2 0 PS N (SUTURE) IMPLANT
SUT MNCRL AB 4-0 PS2 18 (SUTURE) IMPLANT
SUT NOVA NAB GS-21 1 T12 (SUTURE) ×4 IMPLANT
SUT PDS AB 0 CT1 36 (SUTURE) IMPLANT
SUT PROLENE 2 0 KS (SUTURE) IMPLANT
SUT SILK 2 0 (SUTURE) ×2
SUT SILK 2 0 SH CR/8 (SUTURE) IMPLANT
SUT SILK 2-0 18XBRD TIE 12 (SUTURE) ×2 IMPLANT
SUT SILK 3 0 (SUTURE)
SUT SILK 3 0 SH CR/8 (SUTURE) ×2 IMPLANT
SUT SILK 3-0 18XBRD TIE 12 (SUTURE) IMPLANT
SUT V-LOC BARB 180 2/0GR6 GS22 (SUTURE) IMPLANT
SUT VIC AB 2-0 SH 18 (SUTURE) IMPLANT
SUT VIC AB 2-0 SH 27 (SUTURE)
SUT VIC AB 2-0 SH 27X BRD (SUTURE) IMPLANT
SUT VIC AB 3-0 SH 18 (SUTURE) IMPLANT
SUT VIC AB 4-0 PS2 27 (SUTURE) ×4 IMPLANT
SUT VICRYL 0 UR6 27IN ABS (SUTURE) ×2 IMPLANT
SUTURE V-LC BRB 180 2/0GR6GS22 (SUTURE) IMPLANT
SYR 20ML ECCENTRIC (SYRINGE) ×2 IMPLANT
SYS LAPSCP GELPORT 120MM (MISCELLANEOUS) IMPLANT
SYS WOUND ALEXIS 18CM MED (MISCELLANEOUS) ×2 IMPLANT
SYSTEM LAPSCP GELPORT 120MM (MISCELLANEOUS) IMPLANT
SYSTEM WOUND ALEXIS 18CM MED (MISCELLANEOUS) IMPLANT
TOWEL OR 17X26 10 PK STRL BLUE (TOWEL DISPOSABLE) IMPLANT
TOWEL OR NON WOVEN STRL DISP B (DISPOSABLE) ×2 IMPLANT
TRAY FOLEY MTR SLVR 16FR STAT (SET/KITS/TRAYS/PACK) ×2 IMPLANT
TROCAR ADV FIXATION 5X100MM (TROCAR) ×2 IMPLANT
TUBING CONNECTING 10 (TUBING) ×6 IMPLANT
TUBING INSUFFLATION 10FT LAP (TUBING) ×2 IMPLANT
TUBING UROLOGY SET (TUBING) IMPLANT

## 2023-10-03 NOTE — Anesthesia Procedure Notes (Signed)
Procedure Name: Intubation Date/Time: 10/03/2023 12:00 PM  Performed by: Garth Bigness, CRNAPre-anesthesia Checklist: Patient identified, Emergency Drugs available, Suction available and Patient being monitored Patient Re-evaluated:Patient Re-evaluated prior to induction Oxygen Delivery Method: Circle system utilized Preoxygenation: Pre-oxygenation with 100% oxygen Induction Type: IV induction Ventilation: Mask ventilation without difficulty Laryngoscope Size: Mac and 4 Grade View: Grade III Tube type: Oral Tube size: 7.5 mm Number of attempts: 1 Airway Equipment and Method: Stylet and Oral airway Placement Confirmation: ETT inserted through vocal cords under direct vision, positive ETCO2 and breath sounds checked- equal and bilateral Secured at: 23 cm Tube secured with: Tape Dental Injury: Teeth and Oropharynx as per pre-operative assessment  Difficulty Due To: Difficult Airway- due to limited oral opening

## 2023-10-03 NOTE — H&P (Signed)
Chief Complaint: New Consultation (Diverticulitis)  History of Present Illness: Brian [name] is a 42 y.o. male who is seen today as an office consultation for evaluation of New Consultation (Diverticulitis)  He was hospitalized for diverticulitis in April. He was in [place] in August and started having symptoms and got antibiotics started quickly. Since then he has been eating ok and has not had persistent abdominal pain. He also had one attack a few years ago also treated at outpatient.  He had a boss that had an acute attack and had severe sepsis.  Review of Systems: A complete review of systems was obtained from the patient. I have reviewed this information and discussed as appropriate with the patient. See HPI as well for other ROS.  Review of Systems  Constitutional: Negative.  HENT: Negative.  Eyes: Negative.  Respiratory: Negative.  Cardiovascular: Negative.  Gastrointestinal: Negative.  Genitourinary: Negative.  Musculoskeletal: Negative.  Skin: Negative.  Neurological: Negative.  Endo/Heme/Allergies: Negative.  Psychiatric/Behavioral: Negative.    Medical History: History reviewed. No pertinent past medical history.  There is no problem list on file for this patient.  Past Surgical History:  Procedure Laterality Date  SHOULDER SURGERY    No Known Allergies  Current Outpatient Medications on File Prior to Visit  Medication Sig Dispense Refill  testosterone cypionate (DEPO-TESTOSTERONE) 200 mg/mL injection Inject into the muscle  venlafaxine (EFFEXOR-XR) 150 MG XR capsule Take 150 mg by mouth daily with breakfast   No current facility-administered medications on file prior to visit.   History reviewed. No pertinent family history.   Social History   Tobacco Use  Smoking Status Never  Smokeless Tobacco Never    Social History   Socioeconomic History  Marital status: Married  Tobacco Use  Smoking status: Never  Smokeless tobacco: Never  Vaping Use   Vaping status: Never Used  Substance and Sexual Activity  Alcohol use: Yes  Drug use: Never   Social Drivers of Corporate investment banker Strain: Low Risk (03/29/2023)  Received from [facility]  Overall Financial Resource Strain (CARDIA)  Difficulty of Paying Living Expenses: Not hard at all  Food Insecurity: No Food Insecurity (03/29/2023)  Received from [facility]  Hunger Vital Sign  Worried About Running Out of Food in the Last Year: Never true  Ran Out of Food in the Last Year: Never true  Transportation Needs: No Transportation Needs (03/29/2023)  Received from [facility] - Transportation  Lack of Transportation (Medical): No  Lack of Transportation (Non-Medical): No  Physical Activity: Sufficiently Active (03/29/2023)  Received from [facility]  Exercise Vital Sign  Days of Exercise per Week: 6 days  Minutes of Exercise per Session: 90 min  Stress: Stress Concern Present (03/29/2023)  Received from [facility] - Occupational Stress Questionnaire  Feeling of Stress : To some extent  Social Connections: Moderately Integrated (03/29/2023)  Received from [facility]  Social Connection and Isolation Panel [NHANES]  Frequency of Communication with Friends and Family: Once a week  Frequency of Social Gatherings with Friends and Family: Never  Attends Religious Services: More than 4 times per year  Active Member of [employer] or Organizations: Yes  Attends Banker Meetings: 1 to 4 times per year  Marital Status: Married   Objective:   Vitals:  09/15/23 1055  BP: 121/80  Pulse: 93  Temp: 37.3 C (99.1 F)  SpO2: 98%  Weight: 96.2 kg (212 lb)  Height: 177.8 cm (  5\' 10" )  PainSc: 0-No pain   Body mass index is 30.42 kg/m.  Physical Exam Constitutional:  Appearance: Normal appearance.  HENT:  Head: Normocephalic and atraumatic.  Pulmonary:  Effort: Pulmonary effort is normal.  Abdominal:  Comments:  Nontender, no hernias  Musculoskeletal:  General: Normal range of motion.  Cervical back: Normal range of motion.  Neurological:  General: No focal deficit present.  Mental Status: He is alert and oriented to person, place, and time. Mental status is at baseline.  Psychiatric:  Mood and Affect: Mood normal.  Behavior: Behavior normal.  Thought Content: Thought content normal.     Labs, Imaging and Diagnostic Testing: I reviewed images of CT scan from August 2024 and April 2024 showing acute diverticulitis and question of intramural abscess. I reviewed 2023 colonoscopy results which did not show diverticulosis at the time. I reviewed clinic notes from [facility]  Assessment and Plan:   Diagnoses and all orders for this visit:  Diverticulitis of large intestine without perforation or abscess without bleeding    He has had 3 attacks, 1 in April requiring admission for IV antibiotics. He had a colonoscopy in 2023 showing 1 polyp. We discussed risks and benefits of surgery vs nonoperative management and recurrence likelihood.  The anatomy & physiology of the digestive tract was discussed. The pathophysiology of the colon was discussed. Natural history risks without surgery was discussed. I feel the risks of no intervention will lead to serious problems that outweigh the operative risks; therefore, I recommended a partial colectomy to remove the pathology. Minimally invasive (Robotic/Laparoscopic) & open techniques were discussed.   Risks such as bleeding, infection, abscess, leak, reoperation, injury to other organs, need for repair of tissues / organs, possible ostomy, hernia, heart attack, stroke, death, and other risks were discussed. I noted a good likelihood this will help address the problem. Goals of post-operative recovery were discussed as well. Need for adequate nutrition, daily bowel regimen and healthy physical activity, to optimize recovery was noted as well. We will work  to minimize complications. Educational materials were available as well. Questions were answered. The patient expresses understanding & wishes to proceed with surgery.

## 2023-10-03 NOTE — Anesthesia Preprocedure Evaluation (Addendum)
Anesthesia Evaluation  Patient identified by MRN, date of birth, ID band Patient awake    Reviewed: Allergy & Precautions, H&P , NPO status , Patient's Chart, lab work & pertinent test results  Airway Mallampati: II  TM Distance: >3 FB Neck ROM: Full    Dental no notable dental hx. (+) Teeth Intact, Dental Advisory Given   Pulmonary neg pulmonary ROS   Pulmonary exam normal breath sounds clear to auscultation       Cardiovascular negative cardio ROS  Rhythm:Regular Rate:Normal     Neuro/Psych    Depression    negative neurological ROS     GI/Hepatic negative GI ROS, Neg liver ROS,,,  Endo/Other  negative endocrine ROS    Renal/GU negative Renal ROS  negative genitourinary   Musculoskeletal   Abdominal   Peds  Hematology negative hematology ROS (+)   Anesthesia Other Findings   Reproductive/Obstetrics negative OB ROS                             Anesthesia Physical Anesthesia Plan  ASA: 2  Anesthesia Plan: General   Post-op Pain Management: Tylenol PO (pre-op)*   Induction: Intravenous  PONV Risk Score and Plan: 3 and Ondansetron, Dexamethasone and Midazolam  Airway Management Planned: Oral ETT  Additional Equipment:   Intra-op Plan:   Post-operative Plan: Extubation in OR  Informed Consent: I have reviewed the patients History and Physical, chart, labs and discussed the procedure including the risks, benefits and alternatives for the proposed anesthesia with the patient or authorized representative who has indicated his/her understanding and acceptance.     Dental advisory given  Plan Discussed with: CRNA  Anesthesia Plan Comments:        Anesthesia Quick Evaluation

## 2023-10-03 NOTE — Op Note (Signed)
Preoperative diagnosis: Diverticulitis   Postoperative diagnosis: Diverticulitis   Procedure:  Bilateral retrograde pyelogram with ICG instillation to both ureters   Surgeon: Dr. Vilma Prader  Anesthesia: General  Complications: None  Intraoperative findings: bilateral retrograde pyelography demonstrated no filling defect with contrast reaching the both collecting systems   EBL: Minimal   Disposition of specimens: [facility] for stone analysis  Indication: Brian [name] is a 42 y.o.   patient with a hx of diverticulitis getting a hemicolectomy today. General surgery has asked to have firefly administered to both ureters. After reviewing the management options for treatment, the patient elected to proceed with the above surgical procedure(s). We have discussed the potential benefits and risks of the procedure, side effects of the proposed treatment, the likelihood of the patient achieving the goals of the procedure, and any potential problems that might occur during the procedure or recuperation. Informed consent has been obtained.   Description of procedure:  The patient was taken to the operating room and general anesthesia was induced.  The patient was placed in the dorsal lithotomy position, prepped and draped in the usual sterile fashion, and preoperative antibiotics were administered. A preoperative time-out was performed.   Cystourethroscopy was performed.  The patient's urethra was examined and was except fr a very high bladder neck cystoscopy was performed and the bladder systematically examined in its entirety. There was no evidence for any bladder tumors, stones, or other mucosal pathology.    Attention then turned to the left ureteral orifice and a 5 fr ureteral catheter was used to intubate the ureteral orifice.  Omnipaque with ICG was injected through the ureteral catheter and a retrograde pyelogram was performed with findings as dictated above. The  exact same was done for he patients Right side.   The scope was removed and the 51fr catheter was attempted and could nto pass the high bladder neck. An 78fr coude then passed easily. 10cc of water were inflated into the balloon.   The case the was turned over to the General surgery team.    Disposition: Patient can be void trailed when the primary team deems it appropriate.

## 2023-10-03 NOTE — Op Note (Signed)
Preoperative diagnosis: sigmoid diverticulitis  Postoperative diagnosis: same   Procedure: robotic partial colectomy with colorectal anastomosis  Surgeon: Feliciana Rossetti, M.D.  Asst: Phylliss Blakes, M.D.  Anesthesia: GETA  Indications for procedure: Brian [name] is a 42 y.o. year old male with symptoms of abdominal pain and recurrent bouts of diverticulitis. He presents for sigmoid resection.  Description of procedure: The patient was brought into the operative suite. Anesthesia was administered with General endotracheal anesthesia. WHO checklist was applied. The patient was then placed in lithotomy position. The area was prepped and draped in the usual sterile fashion.  A left subcostal incision was made. A 5mm trocar was used to gain access to the peritoneal cavity by optical entry technique. Pneumoperitoneum was applied with a high flow and low pressure. The laparoscope was reinserted to confirm position. A epigastric 8 mm trocar, a right lateral 8 mm trocar and right lower 12 mm trocar was placed. 1 5 mm trocar was placed in the right upper quadrant  The colon was identified. It appeared adherent to the lateral abdominal wall. Adhesions of the colon to the abdominal wall were lysed with vessel sealer. The medial peritoneal reflection was entered, the vessels were identified. Further dissection of the retroperitoneum allowed identification of the ureter and gonadal vessels. The mesentery of the colon was then taken with vessel sealer. This was continued distally until the rectosigmoid junction. A portion beyond inflammation at the rectosigmoid junction was chosen for division. A blue 60 mm linear stapler was used to divide the large intestine. Total lysis of adhesion time was 30 min.  Next, The white line of Toldt was incised proximally and the left colon mobilized from the abdominal wall.  A location of the distal left colon was chosen for division.   Next, a Fannestiel incision was  made. Cautery was used to dissect through the subcutaneous tissue and the fascia was incised. The anterior rectus was dissected away from the rectus muscle. The pyramidal muslces were identified and appeared larger than average. Peritoneum was entered. A wound protector was put in place. The sigmoid colon specimen. A pursestring clamp was placed across the distal left colon and the sigmoid colon was divided.  A pursestring device was used to pursestring the colon with 2-0 prolene. The pursestring was reinforced with 3-0 silks in 3 places. The colon was dilated and a 29 mm anvil was chosen and pursestring was tied down around the anvil. The colon was placed back into the abdomen. Insufflation was resumed.   The rectum was inspected and dilator passed up to the staple line. A 29 mm EEA stapler was introduced and spike through just anterior to the staple line. The anvil was connected and the stapler was clamped. Anastomosis was performed and stapler removed. Rigid proctoscopy was used to leak test. No bubbles were seen. The abdomen and pelvis were irrigated and fluid removed. Pneumoperitoneum was evacuated. The peritoneum was closed with 0 vicryl. The fascia was closed with 0 PDS in running fashion for the extraction site. The 12 mm trocar fascia was closed with 0 vicryl. Skin incisions were closed with 4-0 monocryl subcuticular stitch. Dermabond was put in place for dressings. Honecomb was placed over the extraction site  Findings: sigmoid diverticulitis, intact anastomosis  Specimen: sigmoid colon staple line marks distal  Implant: none   Blood loss: 50 ml  Local anesthesia:  50 ml Exparel:Marcaine Mix  Complications: none  Feliciana Rossetti, M.D. General, Bariatric, & Minimally Invasive Surgery [facility]

## 2023-10-03 NOTE — Transfer of Care (Signed)
Immediate Anesthesia Transfer of Care Note  Patient: Brian [name]  Procedure(s) Performed: XI ROBOT ASSISTED LOW ANTERIOR RESECTION (Abdomen) CYSTOSCOPY with FIREFLY INJECTION (Bladder)  Patient Location: PACU  Anesthesia Type:General  Level of Consciousness: oriented, drowsy, and patient cooperative  Airway & Oxygen Therapy: Patient Spontanous Breathing and Patient connected to face mask oxygen  Post-op Assessment: Report given to RN and Post -op Vital signs reviewed and stable  Post vital signs: Reviewed and stable  Last Vitals:  Vitals Value Taken Time  BP 157/94 10/03/23 1456  Temp    Pulse 109 10/03/23 1500  Resp 19 10/03/23 1500  SpO2 99 % 10/03/23 1500  Vitals shown include unfiled device data.  Last Pain:  Vitals:   10/03/23 0940  TempSrc: Oral  PainSc:          Complications: No notable events documented.

## 2023-10-03 NOTE — Anesthesia Postprocedure Evaluation (Signed)
Anesthesia Post Note  Patient: Brian [name]  Procedure(s) Performed: XI ROBOT ASSISTED LOW ANTERIOR RESECTION (Abdomen) CYSTOSCOPY with FIREFLY INJECTION (Bladder)     Patient location during evaluation: PACU Anesthesia Type: General Level of consciousness: awake and alert Pain management: pain level controlled Vital Signs Assessment: post-procedure vital signs reviewed and stable Respiratory status: spontaneous breathing, nonlabored ventilation, respiratory function stable and patient connected to nasal cannula oxygen Cardiovascular status: blood pressure returned to baseline and stable Postop Assessment: no apparent nausea or vomiting Anesthetic complications: no  No notable events documented.  Last Vitals:  Vitals:   10/03/23 1600 10/03/23 1615  BP: (!) 152/87 (!) 157/80  Pulse: 100 (!) 107  Resp: 14 14  Temp:    SpO2: 92% 93%    Last Pain:  Vitals:   10/03/23 1615  TempSrc:   PainSc: 6                  Tammela Bales,W. EDMOND

## 2023-10-04 LAB — CBC
HCT: 48.6 % (ref 39.0–52.0)
Hemoglobin: 16.5 g/dL (ref 13.0–17.0)
MCH: 30.3 pg (ref 26.0–34.0)
MCHC: 34 g/dL (ref 30.0–36.0)
MCV: 89.3 fL (ref 80.0–100.0)
Platelets: 215 10*3/uL (ref 150–400)
RBC: 5.44 MIL/uL (ref 4.22–5.81)
RDW: 13.1 % (ref 11.5–15.5)
WBC: 15 10*3/uL — ABNORMAL HIGH (ref 4.0–10.5)
nRBC: 0 % (ref 0.0–0.2)

## 2023-10-04 LAB — BASIC METABOLIC PANEL
Anion gap: 9 (ref 5–15)
BUN: 11 mg/dL (ref 6–20)
CO2: 22 mmol/L (ref 22–32)
Calcium: 8.1 mg/dL — ABNORMAL LOW (ref 8.9–10.3)
Chloride: 104 mmol/L (ref 98–111)
Creatinine, Ser: 1.14 mg/dL (ref 0.61–1.24)
GFR, Estimated: >60 mL/min (ref >60)
Glucose, Bld: 116 mg/dL — ABNORMAL HIGH (ref 70–99)
Potassium: 4 mmol/L (ref 3.5–5.1)
Sodium: 135 mmol/L (ref 135–145)

## 2023-10-04 MED ORDER — VENLAFAXINE HCL ER 150 MG PO CP24
150.0000 mg | ORAL_CAPSULE | Freq: Every day | ORAL | Status: DC
  Administered 2023-10-05: 150 mg via ORAL
  Filled 2023-10-04: qty 1

## 2023-10-04 NOTE — Progress Notes (Signed)
   10/04/23 1053  TOC Brief Assessment  Insurance and Status Reviewed  Patient has primary care physician Yes  Home environment has been reviewed home with spouse  Prior level of function: independent  Prior/Current Home Services No current home services  Social Determinants of Health Reivew SDOH reviewed no interventions necessary  Readmission risk has been reviewed Yes  Transition of care needs no transition of care needs at this time

## 2023-10-04 NOTE — Progress Notes (Signed)
1 Day Post-Op  Subjective: He is doing well, he feels bruisy in his abdomen. Lots of flatus overnight, tolerated liquids  ROS: See above, otherwise other systems negative  Objective: Vital signs in last 24 hours: Temp:  [97.6 F (36.4 C)-98.5 F (36.9 C)] 98.5 F (36.9 C) (11/05 0558) Pulse Rate:  [81-109] 93 (11/05 0558) Resp:  [13-19] 18 (11/05 0558) BP: (113-157)/(55-92) 119/63 (11/05 0558) SpO2:  [90 %-100 %] 99 % (11/05 0558) Weight:  [95.7 kg-101.7 kg] 101.7 kg (11/05 0500) Last BM Date : 10/03/23  Intake/Output from previous day: 11/04 0701 - 11/05 0700 In: 3458.6 [P.O.:300; I.V.:3058.6; IV Piggyback:100] Out: 1900 [Urine:1800; Blood:100] Intake/Output this shift: No intake/output data recorded.  PE: Gen: NAd Resp: nonlabored CV: RRR Abd: soft, incisions c/d/i  Lab Results:  Recent Labs    10/03/23 1833 10/04/23 0435  WBC 13.5* 15.0*  HGB 17.9* 16.5  HCT 53.3* 48.6  PLT 222 215   BMET Recent Labs    10/03/23 1833 10/04/23 0435  NA  --  135  K  --  4.0  CL  --  104  CO2  --  22  GLUCOSE  --  116*  BUN  --  11  CREATININE 1.21 1.14  CALCIUM  --  8.1*   PT/INR No results for input(s): "LABPROT", "INR" in the last 72 hours. CMP     Component Value Date/Time   NA 135 10/04/2023 0435   NA 142 04/09/2016 0000   K 4.0 10/04/2023 0435   CL 104 10/04/2023 0435   CO2 22 10/04/2023 0435   GLUCOSE 116 (H) 10/04/2023 0435   BUN 11 10/04/2023 0435   BUN 20 04/09/2016 0000   CREATININE 1.14 10/04/2023 0435   CREATININE 1.22 08/11/2020 1330   CALCIUM 8.1 (L) 10/04/2023 0435   PROT 6.2 03/30/2023 1314   ALBUMIN 4.3 03/30/2023 1314   AST 27 03/30/2023 1314   ALT 36 03/30/2023 1314   ALKPHOS 63 03/30/2023 1314   BILITOT 0.7 03/30/2023 1314   GFRNONAA >60 10/04/2023 0435   Lipase     Component Value Date/Time   LIPASE 26 03/16/2023 1556    Studies/Results: DG C-Arm 1-60 Min-No Report  Result Date: 10/03/2023 Fluoroscopy was utilized by  the requesting physician.  No radiographic interpretation.    Anti-infectives: Anti-infectives (From admission, onward)    Start     Dose/Rate Route Frequency Ordered Stop   10/03/23 1400  neomycin (MYCIFRADIN) tablet 1,000 mg  Status:  Discontinued       Placed in "And" Linked Group   1,000 mg Oral 3 times per day 10/03/23 0930 10/03/23 0939   10/03/23 1400  metroNIDAZOLE (FLAGYL) tablet 1,000 mg  Status:  Discontinued       Placed in "And" Linked Group   1,000 mg Oral 3 times per day 10/03/23 0930 10/03/23 0939   10/03/23 0945  cefoTEtan (CEFOTAN) 2 g in sodium chloride 0.9 % 100 mL IVPB        2 g 200 mL/hr over 30 Minutes Intravenous On call to O.R. 10/03/23 0930 10/03/23 1206       Assessment/Plan  POD 1 robotic sigmoid colon resection for diverticulitis   FEN - soft diet VTE - lovenox ID - periop abx complete Dispo - pain control, ambulate, await defecation  I reviewed last 24 h vitals and pain scores, last 48 h intake and output, last 24 h labs and trends, and last 24 h imaging results.  This care required  moderate level of medical decision making.    LOS: 1 day   [facility] 10/04/2023, 7:27 AM Please see Amion for pager number during day hours 7:00am-4:30pm or 7:00am -11:30am on weekends

## 2023-10-05 ENCOUNTER — Other Ambulatory Visit (HOSPITAL_COMMUNITY): Payer: Self-pay

## 2023-10-05 LAB — SURGICAL PATHOLOGY

## 2023-10-05 LAB — CBC
HCT: 47.9 % (ref 39.0–52.0)
Hemoglobin: 16 g/dL (ref 13.0–17.0)
MCH: 29.9 pg (ref 26.0–34.0)
MCHC: 33.4 g/dL (ref 30.0–36.0)
MCV: 89.5 fL (ref 80.0–100.0)
Platelets: 174 10*3/uL (ref 150–400)
RBC: 5.35 MIL/uL (ref 4.22–5.81)
RDW: 13.1 % (ref 11.5–15.5)
WBC: 9.9 10*3/uL (ref 4.0–10.5)
nRBC: 0 % (ref 0.0–0.2)

## 2023-10-05 MED ORDER — OXYCODONE HCL 5 MG PO TABS
5.0000 mg | ORAL_TABLET | Freq: Four times a day (QID) | ORAL | 0 refills | Status: DC | PRN
  Filled 2023-10-05: qty 10, 3d supply, fill #0

## 2023-10-05 MED ORDER — IBUPROFEN 800 MG PO TABS
800.0000 mg | ORAL_TABLET | Freq: Three times a day (TID) | ORAL | 0 refills | Status: DC | PRN
  Filled 2023-10-05: qty 30, 10d supply, fill #0

## 2023-10-05 NOTE — Discharge Summary (Signed)
Physician Discharge Summary  Brian [name] ZOX:[id number] DOB: August 10, 1981 DOA: 10/03/2023  PCP: Pearline Cables, MD  Admit date: 10/03/2023 Discharge date:  10/05/2023   Recommendations for Outpatient Follow-up:   (include homehealth, outpatient follow-up instructions, specific recommendations for PCP to follow-up on, etc.)   Discharge Diagnoses:  Principal Problem:   Diverticulitis large intestine   Surgical Procedure: robotic sigmoid colectomy with anastomosis  Discharge Condition: Good Disposition: Home  Diet recommendation: low fiber for 3 weeks   Hospital Course:  He presented to the hospital and underwent elective robotic colectomy. He did well he tolerated liquids POD 0, tolerated diet POD 1, had multiple bowel movements POD 1 and was discharged home POD 2.  Discharge Instructions  Discharge Instructions     Call MD for:  persistant nausea and vomiting   Complete by: As directed    Call MD for:  severe uncontrolled pain   Complete by: As directed    Call MD for:  temperature >100.4   Complete by: As directed    Diet - low sodium heart healthy   Complete by: As directed    Discharge wound care:   Complete by: As directed    Shower normal tomorrow. Glue to stay on for 10-14 days. No bandage needed.   Increase activity slowly   Complete by: As directed       Allergies as of 10/05/2023       Reactions   Bee Venom Anaphylaxis   Other Anaphylaxis   Red (fire) ants        Medication List     TAKE these medications    EPINEPHrine 0.3 mg/0.3 mL Soaj injection Commonly known as: EPI-PEN Inject 0.3 mg into the muscle as needed for anaphylaxis.   ibuprofen 800 MG tablet Commonly known as: ADVIL Take 1 tablet (800 mg total) by mouth every 8 (eight) hours as needed.   oxyCODONE 5 MG immediate release tablet Commonly known as: Oxy IR/ROXICODONE Take 1 tablet (5 mg total) by mouth every 6 (six) hours as needed for severe pain (pain score 7-10).    testosterone cypionate 200 MG/ML injection Commonly known as: DEPOTESTOSTERONE CYPIONATE Inject 0.5 mLs (100 mg total) into the muscle once a week. What changed: when to take this   tretinoin 0.05 % cream Commonly known as: RETIN-A Apply 1 application  topically at bedtime as needed (acne).   venlafaxine XR 150 MG 24 hr capsule Commonly known as: EFFEXOR-XR TAKE 1 CAPSULE BY MOUTH DAILY WITH BREAKFAST. What changed:  how much to take when to take this               Discharge Care Instructions  (From admission, onward)           Start     Ordered   10/05/23 0000  Discharge wound care:       Comments: Shower normal tomorrow. Glue to stay on for 10-14 days. No bandage needed.   10/05/23 0828              The results of significant diagnostics from this hospitalization (including imaging, microbiology, ancillary and laboratory) are listed below for reference.    Significant Diagnostic Studies: DG C-Arm 1-60 Min-No Report  Result Date: 10/03/2023 Fluoroscopy was utilized by the requesting physician.  No radiographic interpretation.    Labs: Basic Metabolic Panel: Recent Labs  Lab 10/03/23 1833 10/04/23 0435  NA  --  135  K  --  4.0  CL  --  104  CO2  --  22  GLUCOSE  --  116*  BUN  --  11  CREATININE 1.21 1.14  CALCIUM  --  8.1*   Liver Function Tests: No results for input(s): "AST", "ALT", "ALKPHOS", "BILITOT", "PROT", "ALBUMIN" in the last 168 hours.  CBC: Recent Labs  Lab 10/03/23 1833 10/04/23 0435 10/05/23 0425  WBC 13.5* 15.0* 9.9  HGB 17.9* 16.5 16.0  HCT 53.3* 48.6 47.9  MCV 89.4 89.3 89.5  PLT 222 215 174    CBG: No results for input(s): "GLUCAP" in the last 168 hours.  Principal Problem:   Diverticulitis large intestine   Time coordinating discharge: 15 min

## 2023-10-05 NOTE — Plan of Care (Signed)
  Problem: Education: Goal: Understanding of discharge needs will improve Outcome: Adequate for Discharge Goal: Verbalization of understanding of the causes of altered bowel function will improve Outcome: Adequate for Discharge   Problem: Activity: Goal: Ability to tolerate increased activity will improve Outcome: Adequate for Discharge   Problem: Bowel/Gastric: Goal: Gastrointestinal status for postoperative course will improve Outcome: Adequate for Discharge   Problem: Health Behavior/Discharge Planning: Goal: Identification of community resources to assist with postoperative recovery needs will improve Outcome: Adequate for Discharge   Problem: Nutritional: Goal: Will attain and maintain optimal nutritional status will improve Outcome: Adequate for Discharge   Problem: Clinical Measurements: Goal: Postoperative complications will be avoided or minimized Outcome: Adequate for Discharge   Problem: Respiratory: Goal: Respiratory status will improve Outcome: Adequate for Discharge   Problem: Skin Integrity: Goal: Will show signs of wound healing Outcome: Adequate for Discharge   Problem: Education: Goal: Knowledge of General Education information will improve Description: Including pain rating scale, medication(s)/side effects and non-pharmacologic comfort measures Outcome: Adequate for Discharge   Problem: Health Behavior/Discharge Planning: Goal: Ability to manage health-related needs will improve Outcome: Adequate for Discharge   Problem: Clinical Measurements: Goal: Ability to maintain clinical measurements within normal limits will improve Outcome: Adequate for Discharge Goal: Will remain free from infection Outcome: Adequate for Discharge Goal: Diagnostic test results will improve Outcome: Adequate for Discharge Goal: Respiratory complications will improve Outcome: Adequate for Discharge Goal: Cardiovascular complication will be avoided Outcome: Adequate for  Discharge   Problem: Activity: Goal: Risk for activity intolerance will decrease Outcome: Adequate for Discharge   Problem: Nutrition: Goal: Adequate nutrition will be maintained Outcome: Adequate for Discharge   Problem: Coping: Goal: Level of anxiety will decrease Outcome: Adequate for Discharge   Problem: Elimination: Goal: Will not experience complications related to bowel motility Outcome: Adequate for Discharge Goal: Will not experience complications related to urinary retention Outcome: Adequate for Discharge   Problem: Pain Management: Goal: General experience of comfort will improve Outcome: Adequate for Discharge   Problem: Safety: Goal: Ability to remain free from injury will improve Outcome: Adequate for Discharge   Problem: Skin Integrity: Goal: Risk for impaired skin integrity will decrease Outcome: Adequate for Discharge

## 2023-10-05 NOTE — Progress Notes (Signed)
Assessment unchanged. Pt verbalized understanding of medications and follow up care. IV discontinued. Tylenol 1000 mg po given as ordered per MAR. Discharged via wc to front entrance to meet wife accompanied by NT.

## 2023-10-09 DIAGNOSIS — R0683 Snoring: Secondary | ICD-10-CM | POA: Insufficient documentation

## 2023-10-09 NOTE — Assessment & Plan Note (Signed)
-   Patient has symptoms of loud snoring, witness apnea and non-restorative sleep. No previous sleep studies. Epworth score 9/24. Needs home sleep study to assess for OSA. Reviewed risked on untreated sleep apnea including cardiac arrhthymias, pulm HTN, stoke and diabetes. We also discussed treatment options including weight loss, oral appliance, CPAP therapy or referral to ENT for possible surgical options.

## 2023-10-19 ENCOUNTER — Other Ambulatory Visit (HOSPITAL_BASED_OUTPATIENT_CLINIC_OR_DEPARTMENT_OTHER): Payer: Self-pay

## 2023-10-19 ENCOUNTER — Other Ambulatory Visit: Payer: Self-pay | Admitting: Family Medicine

## 2023-10-19 ENCOUNTER — Encounter: Payer: Self-pay | Admitting: Family Medicine

## 2023-10-19 DIAGNOSIS — E291 Testicular hypofunction: Secondary | ICD-10-CM

## 2023-10-19 MED ORDER — TESTOSTERONE CYPIONATE 200 MG/ML IM SOLN
100.0000 mg | INTRAMUSCULAR | 0 refills | Status: AC
  Filled 2023-10-19: qty 4, 28d supply, fill #0
  Filled 2023-10-20: qty 10, 140d supply, fill #0

## 2023-10-20 ENCOUNTER — Other Ambulatory Visit (HOSPITAL_BASED_OUTPATIENT_CLINIC_OR_DEPARTMENT_OTHER): Payer: Self-pay

## 2023-10-25 ENCOUNTER — Ambulatory Visit: Payer: 59 | Admitting: Adult Health

## 2023-10-25 DIAGNOSIS — R0683 Snoring: Secondary | ICD-10-CM

## 2023-10-30 ENCOUNTER — Other Ambulatory Visit: Payer: Self-pay | Admitting: Family Medicine

## 2023-10-30 DIAGNOSIS — F4323 Adjustment disorder with mixed anxiety and depressed mood: Secondary | ICD-10-CM

## 2023-11-11 ENCOUNTER — Telehealth: Payer: Self-pay | Admitting: Primary Care

## 2023-11-11 DIAGNOSIS — G4730 Sleep apnea, unspecified: Secondary | ICD-10-CM

## 2023-11-11 DIAGNOSIS — R0683 Snoring: Secondary | ICD-10-CM

## 2023-11-12 ENCOUNTER — Telehealth: Payer: Self-pay | Admitting: Pulmonary Disease

## 2023-11-12 ENCOUNTER — Other Ambulatory Visit: Payer: Self-pay | Admitting: Family Medicine

## 2023-11-12 DIAGNOSIS — L700 Acne vulgaris: Secondary | ICD-10-CM

## 2023-11-12 NOTE — Telephone Encounter (Signed)
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  CPAP therapy if there is significant daytime sleepiness or other comorbidities including history of CVA or cardiac disease -If CPAP is chosen as an option of treatment auto titrating CPAP with a pressure setting of 5-15 will be appropriate  2.  Watchful waiting with emphasis on weight loss measures, sleep position modification to optimize lateral sleep, elevating the head of the bed by about 30 degrees may also help.  3.  An oral device may be fashioned for the treatment of mild sleep disordered breathing, will involve referral to dentist.   Follow-up as previously scheduled

## 2023-11-14 NOTE — Telephone Encounter (Signed)
Please let patient know HST showed mild sleep apnea with mild oxygen desaturations  Options are to conservatively manage with weight loss and focus on side sleeping position.  Treatment options requiring medical order include oral appliance or CPAP.  If he would like treatment please set up a follow-up visit to discuss options in greater detail

## 2023-11-17 NOTE — Telephone Encounter (Signed)
Patient made aware of results HST Made would like to be referred to be fitted for oral device.

## 2023-11-17 NOTE — Telephone Encounter (Signed)
Referral to orthodontics placed to eval for oral device.

## 2023-11-17 NOTE — Telephone Encounter (Signed)
See telephone contact from 11/11/2023 (duplicate information)  patient contacted.  Referral for oral appliance placed.

## 2023-12-15 DIAGNOSIS — G4730 Sleep apnea, unspecified: Secondary | ICD-10-CM

## 2024-01-19 ENCOUNTER — Other Ambulatory Visit: Payer: Self-pay | Admitting: Family Medicine

## 2024-01-19 DIAGNOSIS — F4323 Adjustment disorder with mixed anxiety and depressed mood: Secondary | ICD-10-CM

## 2024-01-20 NOTE — Progress Notes (Signed)
 [facility] [address], [address] [place], [place] [address] [phone]  Date:  01/23/2024   Name:  Brian [name]   DOB:  1981/08/17   MRN:  [id number]  PCP:  Pearline Cables, MD    Chief Complaint: Annual Exam   History of Present Illness:  Brian [name] is a 43 y.o. very pleasant male patient who presents with the following:  Pt seen today for recheck visit Last seen by myself in May He was recently admitted for a partial colectomy due to recurrent diverticulitis - done 11/4 No further episodes of diverticulitis since then   Last colon 2023  Flu vaccine- he declines today  Urology is now treating his hypogonadism Pt notes DR Alvester Morin recently checked his CBC and was noted to have polycythemia again- he will donate blood and recheck with urology  He has noted a bump in his right inguinal region- noticed just a few days ago, not tender  His children are 7 and 24 yo    Patient Active Problem List   Diagnosis Date Noted   Loud snoring 10/09/2023   Diverticulitis large intestine 10/03/2023   Diverticulitis 03/16/2023   Diverticulitis of colon 10/28/2016   Injury of right hand 04/19/2012    Past Medical History:  Diagnosis Date   Depression    Diverticulitis 2017   Heart murmur     Past Surgical History:  Procedure Laterality Date   COLONOSCOPY     SHOULDER ARTHROSCOPY Bilateral     Social History   Tobacco Use   Smoking status: Never   Smokeless tobacco: Never  Vaping Use   Vaping status: Never Used  Substance Use Topics   Alcohol use: Yes    Alcohol/week: 8.0 standard drinks of alcohol    Types: 8 Cans of beer per week   Drug use: No    Family History  Problem Relation Age of Onset   Heart attack Father    Diabetes Neg Hx    Hyperlipidemia Neg Hx    Hypertension Neg Hx    Sudden death Neg Hx    Colon cancer Neg Hx    Stomach cancer Neg Hx    Pancreatic cancer Neg Hx    Esophageal  cancer Neg Hx    Liver cancer Neg Hx     Allergies  Allergen Reactions   Bee Venom Anaphylaxis   Other Anaphylaxis    Red (fire) ants    Medication list has been reviewed and updated.  Current Outpatient Medications on File Prior to Visit  Medication Sig Dispense Refill   EPINEPHrine 0.3 mg/0.3 mL IJ SOAJ injection Inject 0.3 mg into the muscle as needed for anaphylaxis. 1 each 0   testosterone cypionate (DEPOTESTOSTERONE CYPIONATE) 200 MG/ML injection Inject 0.5 mLs (100 mg total) into the muscle once a week. 10 mL 0   tretinoin (RETIN-A) 0.05 % cream APPLY TO AFFECTED AREA EVERY DAY AT BEDTIME 20 g 1   ibuprofen (ADVIL) 800 MG tablet Take 1 tablet (800 mg total) by mouth every 8 (eight) hours as needed. 30 tablet 0   oxyCODONE (OXY IR/ROXICODONE) 5 MG immediate release tablet Take 1 tablet (5 mg total) by mouth every 6 (six) hours as needed for severe pain (pain score 7-10). 10 tablet 0   No current facility-administered medications on file prior to visit.    Review of Systems:  As per HPI- otherwise negative.   Physical Examination: Vitals:  01/23/24 1508 01/23/24 1540  BP: 122/80   Pulse: 100 75  SpO2: 94%    Vitals:   01/23/24 1508  Weight: 217 lb 3.2 oz (98.5 kg)  Height: 5\' 10"  (1.778 m)   Body mass index is 31.16 kg/m. Ideal Body Weight: Weight in (lb) to have BMI = 25: 173.9  GEN: no acute distress. Looks well, mild overweight  HEENT: Atraumatic, Normocephalic.  Bilateral TM wnl, oropharynx normal.  PEERL,EOMI.   Ears and Nose: No external deformity. CV: RRR, No M/G/R. No JVD. No thrill. No extra heart sounds. PULM: CTA B, no wheezes, crackles, rhonchi. No retractions. No resp. distress. No accessory muscle use. ABD: S, NT, ND, +BS. No rebound. No HSM. EXTR: No c/c/e PSYCH: Normally interactive. Conversant.  Right inguinal region 1x3cm oblong firm rubbery mass in right inguinal region- seems most likely a cyst   Assessment and Plan: Physical  exam  Adjustment disorder with mixed anxiety and depressed mood - Plan: venlafaxine XR (EFFEXOR-XR) 150 MG 24 hr capsule  Leg mass, right - Plan: [place] RT LOWER EXTREM LTD SOFT TISSUE NON VASCULAR  Screening, lipid - Plan: Lipid panel  Screening for diabetes mellitus - Plan: Comprehensive metabolic panel, Hemoglobin A1c Physical exam today- encouraged healthy diet and exercise routine Doing well with effexor- refilled Labs pending Ordered US to further eval cyst type mass in leg   Signed Abbe Amsterdam, MD  Addendum 2/25, received labs as below.  Message to patient  Results for orders placed or performed in visit on 01/23/24  Comprehensive metabolic panel   Collection Time: 01/23/24  3:46 PM  Result Value Ref Range   Sodium 141 135 - 145 mEq/L   Potassium 4.3 3.5 - 5.1 mEq/L   Chloride 102 96 - 112 mEq/L   CO2 29 19 - 32 mEq/L   Glucose, Bld 83 70 - 99 mg/dL   BUN 15 6 - 23 mg/dL   Creatinine, Ser 1.61 0.40 - 1.50 mg/dL   Total Bilirubin 1.1 0.2 - 1.2 mg/dL   Alkaline Phosphatase 75 39 - 117 U/L   AST 32 0 - 37 U/L   ALT 41 0 - 53 U/L   Total Protein 6.9 6.0 - 8.3 g/dL   Albumin 4.8 3.5 - 5.2 g/dL   GFR 09.60 >45.40 mL/min   Calcium 9.5 8.4 - 10.5 mg/dL  Hemoglobin J8J   Collection Time: 01/23/24  3:46 PM  Result Value Ref Range   Hgb A1c MFr Bld 5.3 4.6 - 6.5 %  Lipid panel   Collection Time: 01/23/24  3:46 PM  Result Value Ref Range   Cholesterol 238 (H) 0 - 200 mg/dL   Triglycerides 191.4 (H) 0.0 - 149.0 mg/dL   HDL 78.29 >56.21 mg/dL   VLDL 30.8 (H) 0.0 - 65.7 mg/dL   LDL Cholesterol 846 (H) 0 - 99 mg/dL   Total CHOL/HDL Ratio 6    NonHDL 195.82

## 2024-01-23 ENCOUNTER — Ambulatory Visit (INDEPENDENT_AMBULATORY_CARE_PROVIDER_SITE_OTHER): Payer: 59 | Admitting: Family Medicine

## 2024-01-23 VITALS — BP 122/80 | HR 75 | Ht 70.0 in | Wt 217.2 lb

## 2024-01-23 DIAGNOSIS — F4323 Adjustment disorder with mixed anxiety and depressed mood: Secondary | ICD-10-CM

## 2024-01-23 DIAGNOSIS — Z13220 Encounter for screening for lipoid disorders: Secondary | ICD-10-CM

## 2024-01-23 DIAGNOSIS — R2241 Localized swelling, mass and lump, right lower limb: Secondary | ICD-10-CM | POA: Diagnosis not present

## 2024-01-23 DIAGNOSIS — Z0000 Encounter for general adult medical examination without abnormal findings: Secondary | ICD-10-CM | POA: Diagnosis not present

## 2024-01-23 DIAGNOSIS — Z131 Encounter for screening for diabetes mellitus: Secondary | ICD-10-CM

## 2024-01-23 MED ORDER — VENLAFAXINE HCL ER 150 MG PO CP24
150.0000 mg | ORAL_CAPSULE | Freq: Every day | ORAL | 3 refills | Status: AC
Start: 2024-01-23 — End: ?

## 2024-01-23 NOTE — Patient Instructions (Signed)
 Good to see you today- I will be in touch with your labs Please stop by imaging on the ground floor to set up your ultrasound also!

## 2024-01-24 ENCOUNTER — Encounter: Payer: Self-pay | Admitting: Family Medicine

## 2024-01-24 LAB — COMPREHENSIVE METABOLIC PANEL
ALT: 41 U/L (ref 0–53)
AST: 32 U/L (ref 0–37)
Albumin: 4.8 g/dL (ref 3.5–5.2)
Alkaline Phosphatase: 75 U/L (ref 39–117)
BUN: 15 mg/dL (ref 6–23)
CO2: 29 meq/L (ref 19–32)
Calcium: 9.5 mg/dL (ref 8.4–10.5)
Chloride: 102 meq/L (ref 96–112)
Creatinine, Ser: 1.2 mg/dL (ref 0.40–1.50)
GFR: 73.76 mL/min (ref >60.00)
Glucose, Bld: 83 mg/dL (ref 70–99)
Potassium: 4.3 meq/L (ref 3.5–5.1)
Sodium: 141 meq/L (ref 135–145)
Total Bilirubin: 1.1 mg/dL (ref 0.2–1.2)
Total Protein: 6.9 g/dL (ref 6.0–8.3)

## 2024-01-24 LAB — LIPID PANEL
Cholesterol: 238 mg/dL — ABNORMAL HIGH (ref 0–200)
HDL: 41.9 mg/dL (ref >39.00)
LDL Cholesterol: 154 mg/dL — ABNORMAL HIGH (ref 0–99)
NonHDL: 195.82
Total CHOL/HDL Ratio: 6
Triglycerides: 209 mg/dL — ABNORMAL HIGH (ref 0.0–149.0)
VLDL: 41.8 mg/dL — ABNORMAL HIGH (ref 0.0–40.0)

## 2024-01-24 LAB — HEMOGLOBIN A1C: Hgb A1c MFr Bld: 5.3 % (ref 4.6–6.5)

## 2024-01-25 ENCOUNTER — Ambulatory Visit (HOSPITAL_BASED_OUTPATIENT_CLINIC_OR_DEPARTMENT_OTHER): Payer: 59

## 2024-02-01 ENCOUNTER — Encounter: Payer: 59 | Admitting: Family Medicine

## 2024-04-25 ENCOUNTER — Ambulatory Visit: Payer: Self-pay

## 2024-04-25 NOTE — Telephone Encounter (Signed)
  Chief Complaint: chest pain  Symptoms: chest pressure in middle of chest, SOB at times, comes and goes, HA more often  Frequency: 2 weeks  Pertinent Negatives: Patient denies dizziness or nausea  Disposition: [] ED /[] Urgent Care (no appt availability in office) / [x] Appointment(In office/virtual)/ []  [facility]/ [] Home Care/ [] Refused Recommended Disposition /[] [place]/ []  Follow-up with PCP Additional Notes: pt denies chest being painful, more pressure. Doesn't matter if doing activity or not. Scheduled appt for 04/30/24 with PCP d/t pt preference. Offered to schedule sooner but pt wants to wait to see PCP. Advised to call back if sx get worse before appt. Pt verbalized understanding.   Copied from CRM [id number]. Topic: Clinical - Red Word Triage >> Apr 25, 2024  4:44 PM [employer] wrote: Red Word that prompted transfer to Nurse Triage: Chest pressure going on for 2 weeks, difficulty breathing. Reason for Disposition  [1] Chest pain(s) lasting a few seconds AND [2] persists > 3 days  Answer Assessment - Initial Assessment Questions 1. LOCATION: "Where does it hurt?"       Middle of chest  2. RADIATION: "Does the pain go anywhere else?" (e.g., into neck, jaw, arms, back)     no 3. ONSET: "When did the chest pain begin?" (Minutes, hours or days)      2 weeks  4. PATTERN: "Does the pain come and go, or has it been constant since it started?"  "Does it get worse with exertion?"      Comes and goes  6. SEVERITY: "How bad is the pain?"  (e.g., Scale 1-10; mild, moderate, or severe)    - MILD (1-3): doesn't interfere with normal activities     - MODERATE (4-7): interferes with normal activities or awakens from sleep    - SEVERE (8-10): excruciating pain, unable to do any normal activities       More pressure pain 1/10 7. CARDIAC RISK FACTORS: "Do you have any history of heart problems or risk factors for heart disease?" (e.g., angina, prior heart attack; diabetes, high  blood pressure, high cholesterol, smoker, or strong family history of heart disease)     HTN  8. PULMONARY RISK FACTORS: "Do you have any history of lung disease?"  (e.g., blood clots in lung, asthma, emphysema, birth control pills)     no 10. OTHER SYMPTOMS: "Do you have any other symptoms?" (e.g., dizziness, nausea, vomiting, sweating, fever, difficulty breathing, cough)       HA  Protocols used: Chest Pain-A-AH

## 2024-04-28 NOTE — Progress Notes (Unsigned)
 [facility] [address], [address] [place], [place] [address] [phone]  Date:  04/30/2024   Name:  Brian [name]   DOB:  May 24, 1981   MRN:  [id number]  PCP:  Kaylee Partridge, MD    Chief Complaint: No chief complaint on file.   History of Present Illness:  Brian [name] is a 43 y.o. very pleasant male patient who presents with the following:  Patient seen today with concern of chest pain.  Most recent visit with myself was in February for follow-up He had a partial colectomy for recurrent diverticulitis in November 2020 for Low testosterone  is managed per urology  EKG on chart from 1 year ago, no other cardiac workup done as of yet  Patient Active Problem List   Diagnosis Date Noted   Loud snoring 10/09/2023   Diverticulitis large intestine 10/03/2023   Diverticulitis 03/16/2023   Diverticulitis of colon 10/28/2016   Injury of right hand 04/19/2012    Past Medical History:  Diagnosis Date   Depression    Diverticulitis 2017   Heart murmur     Past Surgical History:  Procedure Laterality Date   COLONOSCOPY     SHOULDER ARTHROSCOPY Bilateral     Social History   Tobacco Use   Smoking status: Never   Smokeless tobacco: Never  Vaping Use   Vaping status: Never Used  Substance Use Topics   Alcohol use: Yes    Alcohol/week: 8.0 standard drinks of alcohol    Types: 8 Cans of beer per week   Drug use: No    Family History  Problem Relation Age of Onset   Heart attack Father    Diabetes Neg Hx    Hyperlipidemia Neg Hx    Hypertension Neg Hx    Sudden death Neg Hx    Colon cancer Neg Hx    Stomach cancer Neg Hx    Pancreatic cancer Neg Hx    Esophageal cancer Neg Hx    Liver cancer Neg Hx     Allergies  Allergen Reactions   Bee Venom Anaphylaxis   Other Anaphylaxis    Red (fire) ants    Medication list has been reviewed and updated.  Current Outpatient Medications on File Prior to  Visit  Medication Sig Dispense Refill   EPINEPHrine  0.3 mg/0.3 mL IJ SOAJ injection Inject 0.3 mg into the muscle as needed for anaphylaxis. 1 each 0   ibuprofen  (ADVIL ) 800 MG tablet Take 1 tablet (800 mg total) by mouth every 8 (eight) hours as needed. 30 tablet 0   oxyCODONE  (OXY IR/ROXICODONE ) 5 MG immediate release tablet Take 1 tablet (5 mg total) by mouth every 6 (six) hours as needed for severe pain (pain score 7-10). 10 tablet 0   testosterone  cypionate (DEPOTESTOSTERONE CYPIONATE) 200 MG/ML injection Inject 0.5 mLs (100 mg total) into the muscle once a week. 10 mL 0   tretinoin  (RETIN-A ) 0.05 % cream APPLY TO AFFECTED AREA EVERY DAY AT BEDTIME 20 g 1   venlafaxine  XR (EFFEXOR -XR) 150 MG 24 hr capsule Take 1 capsule (150 mg total) by mouth daily with breakfast. 90 capsule 3   No current facility-administered medications on file prior to visit.    Review of Systems:  As per HPI- otherwise negative.   Physical Examination: There were no vitals filed for this visit. There were no vitals filed for this visit. There is no height or weight on file to  calculate BMI. Ideal Body Weight:    GEN: no acute distress. HEENT: Atraumatic, Normocephalic.  Ears and Nose: No external deformity. CV: RRR, No M/G/R. No JVD. No thrill. No extra heart sounds. PULM: CTA B, no wheezes, crackles, rhonchi. No retractions. No resp. distress. No accessory muscle use. ABD: S, NT, ND, +BS. No rebound. No HSM. EXTR: No c/c/e PSYCH: Normally interactive. Conversant.    Assessment and Plan: ***  Signed Gates Kasal, MD

## 2024-04-30 ENCOUNTER — Ambulatory Visit: Admitting: Family Medicine

## 2024-04-30 ENCOUNTER — Encounter: Payer: Self-pay | Admitting: Family Medicine

## 2024-04-30 VITALS — BP 120/78 | HR 68 | Ht 70.0 in | Wt 217.0 lb

## 2024-04-30 DIAGNOSIS — Z91030 Bee allergy status: Secondary | ICD-10-CM | POA: Diagnosis not present

## 2024-04-30 DIAGNOSIS — R0789 Other chest pain: Secondary | ICD-10-CM

## 2024-04-30 DIAGNOSIS — R079 Chest pain, unspecified: Secondary | ICD-10-CM | POA: Diagnosis not present

## 2024-04-30 LAB — LIPID PANEL
Cholesterol: 218 mg/dL — ABNORMAL HIGH (ref 0–200)
HDL: 41.1 mg/dL (ref >39.00)
LDL Cholesterol: 146 mg/dL — ABNORMAL HIGH (ref 0–99)
NonHDL: 176.64
Total CHOL/HDL Ratio: 5
Triglycerides: 153 mg/dL — ABNORMAL HIGH (ref 0.0–149.0)
VLDL: 30.6 mg/dL (ref 0.0–40.0)

## 2024-04-30 LAB — COMPREHENSIVE METABOLIC PANEL WITH GFR
ALT: 36 U/L (ref 0–53)
AST: 27 U/L (ref 0–37)
Albumin: 4.8 g/dL (ref 3.5–5.2)
Alkaline Phosphatase: 60 U/L (ref 39–117)
BUN: 14 mg/dL (ref 6–23)
CO2: 26 meq/L (ref 19–32)
Calcium: 9.8 mg/dL (ref 8.4–10.5)
Chloride: 102 meq/L (ref 96–112)
Creatinine, Ser: 1.08 mg/dL (ref 0.40–1.50)
GFR: 83.54 mL/min (ref >60.00)
Glucose, Bld: 84 mg/dL (ref 70–99)
Potassium: 4 meq/L (ref 3.5–5.1)
Sodium: 138 meq/L (ref 135–145)
Total Bilirubin: 1 mg/dL (ref 0.2–1.2)
Total Protein: 6.8 g/dL (ref 6.0–8.3)

## 2024-04-30 LAB — D-DIMER, QUANTITATIVE: D-Dimer, Quant: <0.19 ug{FEU}/mL (ref <0.50)

## 2024-04-30 LAB — CBC
HCT: 50.8 % (ref 39.0–52.0)
Hemoglobin: 17.3 g/dL — ABNORMAL HIGH (ref 13.0–17.0)
MCHC: 34 g/dL (ref 30.0–36.0)
MCV: 89 fl (ref 78.0–100.0)
Platelets: 218 10*3/uL (ref 150.0–400.0)
RBC: 5.71 Mil/uL (ref 4.22–5.81)
RDW: 13.5 % (ref 11.5–15.5)
WBC: 7.1 10*3/uL (ref 4.0–10.5)

## 2024-04-30 LAB — TROPONIN I (HIGH SENSITIVITY): High Sens Troponin I: 8 ng/L (ref 2–17)

## 2024-04-30 MED ORDER — EPINEPHRINE 0.3 MG/0.3ML IJ SOAJ
0.3000 mg | INTRAMUSCULAR | 99 refills | Status: AC | PRN
Start: 2024-04-30 — End: ?

## 2024-04-30 NOTE — Patient Instructions (Signed)
 Good to see you- I will be in touch with your labs and chest x-ray asap!   If anything is changing or getting worse please seek care We will set you up for a treadmill test asap

## 2024-05-03 ENCOUNTER — Ambulatory Visit (HOSPITAL_COMMUNITY)
Admission: RE | Admit: 2024-05-03 | Discharge: 2024-05-03 | Disposition: A | Discharge status: Home / Self Care | Source: Ambulatory Visit | Attending: Family Medicine | Admitting: Family Medicine

## 2024-05-03 DIAGNOSIS — R0789 Other chest pain: Secondary | ICD-10-CM | POA: Insufficient documentation

## 2024-05-03 LAB — EXERCISE TOLERANCE TEST
Angina Index: 0
Base ST Depression (mm): 0 mm
Estimated workload: 16.5
Exercise duration (min): 13 min
Exercise duration (sec): 46 s
MPHR: 176 {beats}/min
Peak HR: 169 {beats}/min
Percent HR: 96 %
Rest HR: 80 {beats}/min

## 2024-05-04 ENCOUNTER — Encounter: Payer: Self-pay | Admitting: Family Medicine

## 2024-05-04 DIAGNOSIS — R0789 Other chest pain: Secondary | ICD-10-CM

## 2024-05-04 DIAGNOSIS — R079 Chest pain, unspecified: Secondary | ICD-10-CM
